# Patient Record
Sex: Female | Born: 1990 | Hispanic: Yes | Marital: Married | Smoking: Never smoker
Health system: Southern US, Community
[De-identification: ages and names within clinical notes are randomized; demographics above are authoritative.]

## PROBLEM LIST (undated history)

## (undated) DIAGNOSIS — D649 Anemia, unspecified: Secondary | ICD-10-CM

## (undated) DIAGNOSIS — R7303 Prediabetes: Secondary | ICD-10-CM

## (undated) DIAGNOSIS — E119 Type 2 diabetes mellitus without complications: Secondary | ICD-10-CM

## (undated) DIAGNOSIS — Z9109 Other allergy status, other than to drugs and biological substances: Secondary | ICD-10-CM

## (undated) DIAGNOSIS — D509 Iron deficiency anemia, unspecified: Secondary | ICD-10-CM

## (undated) DIAGNOSIS — E669 Obesity, unspecified: Secondary | ICD-10-CM

## (undated) DIAGNOSIS — K219 Gastro-esophageal reflux disease without esophagitis: Secondary | ICD-10-CM

## (undated) HISTORY — DX: Iron deficiency anemia, unspecified: D50.9

## (undated) HISTORY — DX: Prediabetes: R73.03

## (undated) HISTORY — DX: Obesity, unspecified: E66.9

## (undated) HISTORY — DX: Gastro-esophageal reflux disease without esophagitis: K21.9

## (undated) HISTORY — DX: Other allergy status, other than to drugs and biological substances: Z91.09

---

## 2012-12-15 HISTORY — PX: TUBAL LIGATION: SHX77

## 2016-12-15 DIAGNOSIS — Z9109 Other allergy status, other than to drugs and biological substances: Secondary | ICD-10-CM

## 2016-12-15 DIAGNOSIS — K219 Gastro-esophageal reflux disease without esophagitis: Secondary | ICD-10-CM

## 2016-12-15 HISTORY — DX: Other allergy status, other than to drugs and biological substances: Z91.09

## 2016-12-15 HISTORY — DX: Gastro-esophageal reflux disease without esophagitis: K21.9

## 2018-07-08 ENCOUNTER — Ambulatory Visit: Payer: Self-pay | Admitting: Internal Medicine

## 2018-07-08 ENCOUNTER — Telehealth: Payer: Self-pay | Admitting: Licensed Clinical Social Worker

## 2018-07-08 ENCOUNTER — Encounter: Payer: Self-pay | Admitting: Internal Medicine

## 2018-07-08 VITALS — BP 128/88 | HR 74 | Resp 12 | Ht 59.0 in | Wt 158.0 lb

## 2018-07-08 DIAGNOSIS — Z9109 Other allergy status, other than to drugs and biological substances: Secondary | ICD-10-CM

## 2018-07-08 DIAGNOSIS — K219 Gastro-esophageal reflux disease without esophagitis: Secondary | ICD-10-CM

## 2018-07-08 DIAGNOSIS — D509 Iron deficiency anemia, unspecified: Secondary | ICD-10-CM | POA: Insufficient documentation

## 2018-07-08 DIAGNOSIS — Z6831 Body mass index (BMI) 31.0-31.9, adult: Secondary | ICD-10-CM

## 2018-07-08 DIAGNOSIS — E6609 Other obesity due to excess calories: Secondary | ICD-10-CM

## 2018-07-08 DIAGNOSIS — D500 Iron deficiency anemia secondary to blood loss (chronic): Secondary | ICD-10-CM

## 2018-07-08 MED ORDER — FAMOTIDINE 20 MG PO TABS: ORAL_TABLET | ORAL | 2 refills | Status: DC

## 2018-07-08 NOTE — Telephone Encounter (Signed)
LCSW called patient in order to introduce self and provide information about social work services available at the clinic. Pt reported no needs at this time.

## 2018-07-08 NOTE — Patient Instructions (Addendum)
Enfermedad de reflujo gastroesofágico en los adultos  Gastroesophageal Reflux Disease, Adult  Normalmente, la comida baja por el esófago y se mantiene en el estómago, donde se la digiere. Sin embargo, cuando una persona tiene enfermedad de reflujo gastroesofágico (ERGE), la comida y jugo gástrico (ácido estomacal) suben por el esófago. Cuando esto ocurre, el esófago se inflama y duele. Con el tiempo, la ERGE puede producir pequeños agujeros (úlceras) en el revestimiento del esófago.  ¿Cuáles son las causas?  Esta afección se debe a un problema en el músculo que se encuentra entre el esófago y el estómago (esfínter esofágico inferior, o EEI). Normalmente, el EEI se cierra una vez que la comida pasa a través del esófago hasta el estómago. Cuando el EEI se encuentra debilitado o tiene alguna anomalía, no se cierra por completo, y eso permite que tanto la comida como el jugo gástrico, que es ácido, vuelvan a subir por el esófago. El EEI puede debilitarse a causa de ciertas sustancias alimenticias, medicamentos y afecciones, que incluyen:  · Consumo de tabaco.  · Embarazo.  · Tener una hernia de hiato.  · Consumo excesivo de alcohol.  · Ciertos alimentos y bebidas, como café, chocolate, cebollas y menta.    ¿Qué incrementa el riesgo?  Es más probable que esta afección se manifieste en:  · Personas con aumento del peso corporal.  · Personas con trastornos del tejido conjuntivo.  · Personas que toman antiinflamatorios no esteroideos (AINE).    ¿Cuáles son los signos o los síntomas?  Los síntomas de esta afección incluyen lo siguiente:  · Acidez estomacal.  · Dificultad o dolor al tragar.  · Sensación de tener un bulto en la garganta.  · Sabor amargo en la boca.  · Mal aliento.  · Gran cantidad de saliva.  · Estómago inflamado o con malestar.  · Eructos.  · Dolor en el pecho.  · Dificultad para respirar o sibilancias.  · Tos constante (crónica) o tos nocturna.  · Desgaste del esmalte dental.  · Pérdida de peso.     El dolor de pecho puede deberse a distintas enfermedades. Es importante que consulte al médico si tiene dolor de pecho.  ¿Cómo se diagnostica?  El médico le hará una historia clínica y un examen físico. Para determinar si tiene ERGE leve o grave, el médico también puede controlar cómo usted reacciona al tratamiento. También pueden hacerle otros estudios, por ejemplo:  · Una endoscopía para examinarle el estómago y el esófago con una cámara pequeña.  · Una prueba para medir el grado de acidez en el esófago.  · Una prueba para medir cuánta presión hay en el esófago.  · Un estudio de tránsito baritado regular o modificado para ver la forma, el tamaño y el funcionamiento del esófago.    ¿Cómo se trata?  El objetivo del tratamiento es ayudar a aliviar los síntomas y evitar las complicaciones. El tratamiento de esta afección puede variar según la gravedad de los síntomas. El médico puede recomendarle lo siguiente:  · Cambios en la dieta.  · Medicamentos.  · Someterse a una cirugía.    Siga estas instrucciones en su casa:  Dieta  · Siga la dieta recomendada por el médico. Esto puede incluir evitar ciertos alimentos y bebidas, por ejemplo:  ? Café y té (con o sin cafeína).  ? Bebidas que contengan alcohol.  ? Bebidas energéticas y deportivas.  ? Bebidas gaseosas y refrescos.  ? Chocolate y cacao.  ? Menta y esencia de menta.  ?   pizza con salsa de tomate. ? Alimentos fritos y Sylvarenagrasos, Bancroftcomo donas, papas fritas y aderezos ricos en grasas. ? Carnes con alto contenido de grasa, como salchichas, y cortes de carnes rojas y blancas con mucha grasa, por ejemplo, chuletas o costillas, embutidos, jamn y  tocino. ? Productos lcteos ricos en grasas, como leche [place]entera, [place]eredosiamanteca y [place]queso crema.  Haga varias comidas pequeas y frecuentes Freight forwarderdurante el da en lugar de comidas abundantes.  Evite beber grandes cantidades de lquidos con las comidas.  Evite comer 2 o 3horas antes de acostarse.  Evite recostarse inmediatamente despus de comer.  No haga ejercicios enseguida despus de comer. Instrucciones generales  Est atento a cualquier cambio en los sntomas.  CenterPoint Energyome los medicamentos de venta libre y los recetados solamente como se lo haya indicado el mdico. No tome aspirina, ibuprofeno ni otros antiinflamatorios no esteroideos (AINE) a menos que el mdico se lo indique.  No consuma ningn producto que contenga tabaco, lo que incluye cigarrillos, tabaco de Theatre managermascar y Administrator, Civil Servicecigarrillos electrnicos. Si necesita ayuda para dejar de fumar, consulte al mdico.  Use ropas sueltas. No use nada apretado alrededor de la cintura que haga presin sobre el abdomen.  Levante (eleve) la cabecera de la cama 6pulgadas (15cm).  Trate de reducir J. C. Penneyel nivel de estrs con actividades tales como el yoga o la meditacin. Si necesita ayuda para reducir J. C. Penneyel nivel de estrs, consulte al mdico.  Si tiene sobrepeso, baje hasta llegar a un peso saludable para usted. Pdale consejos al mdico para bajar de peso de [place]manera segura.  Concurra a todas las visitas de control como se lo haya indicado el mdico. Esto es importante. Comunquese con un mdico si:  Aparecen nuevos sntomas.  Baja de peso sin causa aparente.  Tiene dificultad para tragar, o le duele cuando traga.  Tiene tos persistente o sibilancias.  Los sntomas no mejoran con Scientist, research (medical)el tratamiento.  Tiene la voz ronca. Solicite ayuda de inmediato si:  Goldman SachsSiente dolor en los brazos, el cuello, la Elephant Headmandbula, los dientes o la espalda.  Se siente transpirado, mareado o tiene una sensacin de desvanecimiento.  Siente falta de aire o Journalist, newspaperdolor en el pecho.  Vomita y el  vmito tiene un aspecto similar a la sangre o a los granos de caf.  Se desmaya.  Las heces son sanguinolentas o negras.  No puede tragar, beber o comer. Esta informacin no tiene Theme park managercomo fin reemplazar el consejo del mdico. Asegrese de hacerle al mdico cualquier pregunta que tenga. Document Released: 09/10/2005 Document Revised: 03/06/2017 Document Reviewed: 03/28/2015 Elsevier Interactive Patient Education  2018 [employer]  [place]ome un vaso de agua antes de cada comida Tome un minimo de 6 a 8 vasos de agua diarios Coma tres veces al dia Coma Neomia Dearuna proteina y Neomia Dearuna grasa saludable con comida.  (huevos, pescado, pollo, pavo, y limite carnes rojas Coma 5 porciones diarias de legumbres.  Mezcle los colores Coma 2 porciones diarias de frutas con cascara cuando sea comestible Use platos pequeos Suelte su tenedor o cuchara despues de cada mordida hata que se mastique y se trague Come en la mesa con amigos o familiares por lo menos una vez al dia [place] la televisin y aparatos electrnicos durante la comida  Su objetivo debe ser perder una libra por semana  Estudios recientes indican que las personas quienes consumen todos de sus calorias durante 12 horas se bajan de pesocon Mas eficiencia.  Por ejemplo, si Usted come su primera comida a las 7:00 a.m., su comida final del  dia se debe completar antes de las 7:00 p.m.

## 2018-07-08 NOTE — Progress Notes (Signed)
Subjective:    Patient ID: Janice Mccormick, female    DOB: 08/25/1991, 27 y.o.   MRN: [id number]  HPI   Here to establish  1.  GERD:  [facility] [place]  Underwent abdominal ultrasound for LUQ discomfort and responded well to treatment with PPI Omeprazole end of 2018.   H. Pylori testing was negative in 2/18 Abdominal ultrasound 03/2017 and normal Made changes to diet and was able to come off PPI without symptoms.   She is only having same discomfort when eats something she knows she shouldn't.  This occurs maybe once monthly.   She notes currently that she is always tender in LUQ when she pushes in the area. She presses on the area frequently.  Later, patient states she has not used Omeprazole since 11/2017. States while the medication helped with her abdominal pain, it did not help with acid and food coming up into throat. If she eats late or eats certain foods--coffee, soda, pepper.  No problems with tomatoes, but onions set it off.  She does not smoke or drink alcohol. Does use Naproxen once monthly for throat pain.   Has symptoms with reflux as above maybe twice weekly.   Does not have HOB elevated      2.  Iron deficiency anemia with Hgb at a low of 11.6  02/2017 Iron supplementation brought this up to 12.1  07/2017. After being off iron for a couple of months, her hemoglobin remained stable at 12.7 10/2017 Felt to be due to blood loss with menstruation. She would like to have this rechecked to make sure she is not anemic.  3.  Allergies:  Environmental.  [place] a [place]clean[place] in [place] and had nasal congestion and watery eyes with dust and cat dander.  Used Flonase nasally and  Cetirizine 10 mg daily with significant improvement   No outpatient medications have been marked as taking for the 07/08/18 encounter (Office Visit) with Julieanne MansonMulberry, Davan Nawabi, MD.    No Known Allergies   Past Medical History:  Diagnosis Date  . Environmental  allergies 2018  . GERD (gastroesophageal reflux disease) 2018   [facility] [place]  abdominal [place] normal  . Iron deficiency anemia    due to chronic menstrual blood loss   Past Surgical History:  Procedure Laterality Date  . CESAREAN SECTION  2012, 2014  . TUBAL LIGATION  2014   with last c section   Family History  Problem Relation Age of Onset  . Diabetes Mother   . Hypertension Mother   . Diabetes Sister    Social History   Socioeconomic History  . Marital status: Married    Spouse name: Maggie SchwalbeMartin Martinez  . Number of children: 2  . Years of education: 412  . Highest education level: 12th grade  Occupational History  . Occupation: housewife  Social Needs  . Financial resource strain: Not on file  . Food insecurity:    Worry: Sometimes true    Inability: Sometimes true  . Transportation needs:    Medical: Yes    Non-medical: Yes  Tobacco Use  . Smoking status: Never Smoker  . Smokeless tobacco: Never Used  Substance and Sexual Activity  . Alcohol use: Never    Frequency: Never  . Drug use: Never  . Sexual activity: Yes  Lifestyle  . Physical activity:    Days per week: 0 days    Minutes per session: Not on file  . Stress: Not  on file  Relationships  . Social connections:    Talks on phone: Not on file    Gets together: Not on file    Attends religious service: Not on file    Active member of club or organization: Not on file    Attends meetings of clubs or organizations: Not on file    Relationship status: Not on file  . Intimate partner violence:    Fear of current or ex partner: Not on file    Emotionally abused: Not on file    Physically abused: Not on file    Forced sexual activity: Not on file  Other Topics Concern  . Not on file  Social History Narrative   Moved from [place] to [place] beginning in 2018   Lives at home with husband and 2 daughters.   Brother in Social worker lives with them at times.   Review of Systems     Objective:    Physical Exam  NAD HEENT: PERRL, EOM Large tonsils bilaterally, but without erythema or exudate. Neck:  Supple, No adenopathy Chest:  CTA CV: RRR with normal S1 and S2, No S3, S4 or murmur.  Radial pulses normal and equal. Abd:  S, Mild epigastric tenderness. No rebound or peritoneal signs. No HSM or mass, + BS       Assessment & Plan:  1.  Social support:  Given pamphlet for support   2.  GERD:  GERD precautions discussed along with recommendations to change lifestyle for weight loss. Famotidine 20 mg 2 tabs at bedtime Avoid foods that cause symptoms. Follow up n 2 months  3.  Iron deficiency anemia:  CBC  4.  Allergies:  Not a problem since move.  5.  Obesity: as above.  Encouraged entire household to work on this together with lifestyle changes.

## 2018-07-09 LAB — CBC WITH DIFFERENTIAL/PLATELET
BASOS ABS: 0 10*3/uL (ref 0.0–0.2)
Basos: 0 %
EOS (ABSOLUTE): 0 10*3/uL (ref 0.0–0.4)
Eos: 0 %
HEMOGLOBIN: 11.8 g/dL (ref 11.1–15.9)
Hematocrit: 35.8 % (ref 34.0–46.6)
Immature Grans (Abs): 0 10*3/uL (ref 0.0–0.1)
Immature Granulocytes: 0 %
LYMPHS ABS: 2.2 10*3/uL (ref 0.7–3.1)
Lymphs: 23 %
MCH: 29.1 pg (ref 26.6–33.0)
MCHC: 33 g/dL (ref 31.5–35.7)
MCV: 88 fL (ref 79–97)
MONOCYTES: 4 %
Monocytes Absolute: 0.4 10*3/uL (ref 0.1–0.9)
Neutrophils Absolute: 7 10*3/uL (ref 1.4–7.0)
Neutrophils: 73 %
Platelets: 383 10*3/uL (ref 150–450)
RBC: 4.05 x10E6/uL (ref 3.77–5.28)
RDW: 14.7 % (ref 12.3–15.4)
WBC: 9.7 10*3/uL (ref 3.4–10.8)

## 2018-09-02 ENCOUNTER — Ambulatory Visit: Payer: Self-pay | Admitting: Internal Medicine

## 2018-09-09 ENCOUNTER — Ambulatory Visit: Payer: Self-pay | Admitting: Internal Medicine

## 2018-09-15 ENCOUNTER — Encounter: Payer: Self-pay | Admitting: Internal Medicine

## 2018-09-15 ENCOUNTER — Ambulatory Visit: Payer: Self-pay | Admitting: Internal Medicine

## 2018-09-15 VITALS — BP 118/78 | HR 80 | Resp 12 | Ht 59.0 in | Wt 153.0 lb

## 2018-09-15 DIAGNOSIS — D500 Iron deficiency anemia secondary to blood loss (chronic): Secondary | ICD-10-CM

## 2018-09-15 DIAGNOSIS — K219 Gastro-esophageal reflux disease without esophagitis: Secondary | ICD-10-CM

## 2018-09-15 MED ORDER — FAMOTIDINE 20 MG PO TABS: ORAL_TABLET | ORAL | 4 refills | Status: DC

## 2018-09-15 NOTE — Progress Notes (Signed)
   Subjective:    Patient ID: Janice Mccormick, female    DOB: 08-18-1991, 27 y.o.   MRN: [id number]  HPI   1.  GERD:  Has lost 5 lbs.  Taking Famotidine 40 mg at bedtime nightly.  Avoiding the foods causing the symptoms.  She is only having symptoms once monthly and only if eats something spicy.    2.  Iron deficiency anemia:  CBC showed low normal hemoglobin.    3.  Obesity:  As above, has lost 5 lbs over past 2 months. She is walking and eating better with smaller portions.  Drinking lots of water.    Current Meds  Medication Sig  . famotidine (PEPCID) 20 MG tablet 2 tabs by mouth at bedtime    No Known Allergies    Review of Systems     Objective:   Physical Exam NAD Lungs:  CTA CV:  RRR without murmur or rub. Abd:  S, NT, No HSM or mass, + bs       Assessment & Plan:  1.  GERD:  Much improved with lifestyle changes and Famotidine.  As she continues with weight loss, hopefully can consider coming off Famotidine.   2.  Anemia:  Currently a resolved issue. Encouraged foods high in iron except for rogan and red meats.    3.  HM:  No CPE for 1 years.  Pap, which was normal was about 8 months ago.  She is not interested in a flu vaccine.  States she is getting a free flu shot elsewhere--where she is obtaining food.

## 2018-12-16 ENCOUNTER — Encounter: Payer: Self-pay | Admitting: Internal Medicine

## 2018-12-17 ENCOUNTER — Encounter: Payer: Self-pay | Admitting: Internal Medicine

## 2018-12-17 ENCOUNTER — Ambulatory Visit: Payer: Self-pay | Admitting: Internal Medicine

## 2018-12-17 VITALS — BP 128/82 | HR 88 | Resp 12 | Ht 59.0 in | Wt 150.0 lb

## 2018-12-17 DIAGNOSIS — Z0000 Encounter for general adult medical examination without abnormal findings: Secondary | ICD-10-CM

## 2018-12-17 DIAGNOSIS — K0889 Other specified disorders of teeth and supporting structures: Secondary | ICD-10-CM

## 2018-12-17 NOTE — Progress Notes (Signed)
Subjective:   Patient ID: Janice MarvelNayeli Parada Mccormick, female    DOB: 03/15/1991, 28 y.o.   MRN: [id number]  HPI   CPE without pap  1.  Pap:  Last done about 10/2017 and [place] in [place]  No history of abnormal pap.  No family history of cervical cancer.  2.  Mammogram:  Never.  No family history of breast cancer.  3.  Osteoprevention:  2 servings of cheese a day.  2 servings of milk daily.  She is performing weight bearing activity.  Not daily.    4.  Guaiac Cards:   Never.  .  5.  Colonoscopy:  Never.  No family history of colon cancer.  6.  Immunizations:   Immunization History  Administered Date(s) Administered  . Tdap 12/16/2015    7.  Glucose/Cholesterol:  Has never had glucose or cholesterol checked that she is aware of.    8.  States last year tested for HIV, Hepatitis panel and negative.  Current Meds  Medication Sig  . famotidine (PEPCID) 20 MG tablet 2 tabs by mouth at bedtime   No Known Allergies   Past Medical History:  Diagnosis Date  . Environmental allergies 2018  . GERD (gastroesophageal reflux disease) 2018   [facility] [place]  abdominal ultrasound normal  . Iron deficiency anemia    due to chronic menstrual blood loss     Past Surgical History:  Procedure Laterality Date  . CESAREAN SECTION  2012, 2014  . TUBAL LIGATION  2014   with last c section     Family History  Problem Relation Age of Onset  . Diabetes Mother   . Hypertension Mother   . Diabetes Sister      Social History   Socioeconomic History  . Marital status: Married    Spouse name: Maggie SchwalbeMartin Martinez  . Number of children: 2  . Years of education: 3212  . Highest education level: 12th grade  Occupational History  . Occupation: housewife  Social Needs  . Financial resource strain: Not on file  . Food insecurity:    Worry: Never true    Inability: Never true  . Transportation needs:    Medical: No    Non-medical: No  Tobacco Use  . Smoking status: Never  Smoker  . Smokeless tobacco: Never Used  Substance and Sexual Activity  . Alcohol use: Never    Frequency: Never  . Drug use: Never  . Sexual activity: Yes    Birth control/protection: Surgical  Lifestyle  . Physical activity:    Days per week: 3 days    Minutes per session: 30 min  . Stress: Not on file  Relationships  . Social connections:    Talks on phone: More than three times a week    Gets together: More than three times a week    Attends religious service: Never    Active member of club or organization: No    Attends meetings of clubs or organizations: Never    Relationship status: Married  . Intimate partner violence:    Fear of current or ex partner: No    Emotionally abused: No    Physically abused: No    Forced sexual activity: No  Other Topics Concern  . Not on file  Social History Narrative   [place]   Lives at home with husband and 2 daughters.   Brother in Social workerlaw lives with them at times.  Review of Systems  Constitutional: Negative for appetite change and fatigue.  HENT: Positive for dental problem (pain from one of her wisdom teeth, upper right.  ), rhinorrhea (4-5 days.  Using unknown cough syrup, which is helping.) and sore throat (With drainage.). Negative for ear pain and hearing loss.   Eyes: Positive for itching (at times. She has bumps on eyes that she thinks causes this.  Allergy medicine helps (pill)).  Respiratory: Negative for cough and shortness of breath.   Cardiovascular: Negative for chest pain, palpitations and leg swelling.  Gastrointestinal: Negative for abdominal pain, blood in stool (No melena.), constipation and diarrhea.  Genitourinary: Positive for menstrual problem (Had a heavy period 2 months ago.  Regular. ). Negative for dysuria, frequency and vaginal discharge.  Musculoskeletal: Negative for arthralgias.  Skin: Negative for rash.  Neurological: Negative for weakness and numbness.    Psychiatric/Behavioral: Negative for decreased concentration. The patient is not nervous/anxious.       Objective:  Physical Exam Constitutional:      Appearance: Normal appearance.  HENT:     Head: Normocephalic and atraumatic.     Jaw: There is normal jaw occlusion.     Right Ear: Hearing, tympanic membrane, ear canal and external ear normal.     Left Ear: Hearing, tympanic membrane, ear canal and external ear normal.     Nose: Nose normal.     Mouth/Throat:     Mouth: Mucous membranes are moist.     Comments: Posterior molars and wisdom teeth all with deep caries. Eyes:     Extraocular Movements: Extraocular movements intact.     Conjunctiva/sclera: Conjunctivae normal.     Pupils: Pupils are equal, round, and reactive to light.     Comments: Discs sharp bilaterally  Neck:     Musculoskeletal: Full passive range of motion without pain, normal range of motion and neck supple.  Cardiovascular:     Rate and Rhythm: Normal rate and regular rhythm.     Heart sounds: S1 normal and S2 normal. No murmur. No friction rub. No S3 or S4 sounds.      Comments: Carotid, radial, femoral, DP and PT pulses normal and equal.  Pulmonary:     Effort: Pulmonary effort is normal.     Breath sounds: Normal breath sounds.  Chest:     Comments: Breasts without focal mass, skin dimpling, nipple discharge or axillary adenopathy. Abdominal:     General: Bowel sounds are normal.     Palpations: Abdomen is soft. There is no hepatomegaly, splenomegaly or mass.     Tenderness: There is no abdominal tenderness.     Hernia: No hernia is present.     Comments: Midline abdominal incisional scar to umbilicus  Genitourinary:    Comments: Normal external female genitalia No discharge No uterine or adnexal mass or tenderness.  Musculoskeletal: Normal range of motion.  Lymphadenopathy:     Head:     Right side of head: No submental or submandibular adenopathy.     Left side of head: No submental or  submandibular adenopathy.     Cervical: No cervical adenopathy.     Upper Body:     Right upper body: No supraclavicular adenopathy.     Left upper body: No supraclavicular adenopathy.     Lower Body: No right inguinal adenopathy. No left inguinal adenopathy.  Skin:    General: Skin is warm.     Capillary Refill: Capillary refill takes less than 2 seconds.  Findings: No rash.  Neurological:     General: No focal deficit present.     Mental Status: She is alert and oriented to person, place, and time.     Cranial Nerves: Cranial nerves are intact.     Sensory: Sensation is intact.     Motor: Motor function is intact.     Coordination: Coordination is intact. Coordination normal.     Gait: Gait is intact.     Deep Tendon Reflexes: Reflexes are normal and symmetric.  Psychiatric:        Attention and Perception: Attention and perception normal.        Mood and Affect: Mood and affect normal.        Speech: Speech normal.        Thought Content: Thought content normal.        Cognition and Memory: Cognition normal.        Judgment: Judgment normal.             Assessment & Plan:  1.  CPE No pap Return for fasting labs:  CMP and FLP in 1 week. Encouraged influenza vaccine yearly in the fall.  [facility] current season.  2.  Dental decay:  Dental referral.

## 2018-12-24 ENCOUNTER — Other Ambulatory Visit: Payer: Self-pay

## 2018-12-24 ENCOUNTER — Other Ambulatory Visit: Payer: Self-pay | Admitting: Internal Medicine

## 2018-12-24 DIAGNOSIS — Z0000 Encounter for general adult medical examination without abnormal findings: Secondary | ICD-10-CM

## 2018-12-24 DIAGNOSIS — D500 Iron deficiency anemia secondary to blood loss (chronic): Secondary | ICD-10-CM

## 2019-01-15 LAB — LIPID PANEL W/O CHOL/HDL RATIO
Cholesterol, Total: 164 mg/dL (ref 100–199)
HDL: 40 mg/dL (ref >39)
LDL Calculated: 114 mg/dL — ABNORMAL HIGH (ref 0–99)
TRIGLYCERIDES: 52 mg/dL (ref 0–149)
VLDL CHOLESTEROL CAL: 10 mg/dL (ref 5–40)

## 2019-01-15 LAB — COMPREHENSIVE METABOLIC PANEL
ALT: 18 IU/L (ref 0–44)
AST: 22 IU/L (ref 0–40)
Albumin/Globulin Ratio: 1.3 (ref 1.2–2.2)
Albumin: 4.3 g/dL (ref 4.1–5.2)
Alkaline Phosphatase: 99 IU/L (ref 39–117)
BILIRUBIN TOTAL: 0.6 mg/dL (ref 0.0–1.2)
BUN/Creatinine Ratio: 12 (ref 9–20)
BUN: 8 mg/dL (ref 6–20)
CO2: 22 mmol/L (ref 20–29)
Calcium: 9.2 mg/dL (ref 8.7–10.2)
Chloride: 102 mmol/L (ref 96–106)
Creatinine, Ser: 0.65 mg/dL — ABNORMAL LOW (ref 0.76–1.27)
GFR calc Af Amer: 152 mL/min/{1.73_m2} (ref >59)
GFR calc non Af Amer: 131 mL/min/{1.73_m2} (ref >59)
GLUCOSE: 80 mg/dL (ref 65–99)
Globulin, Total: 3.2 g/dL (ref 1.5–4.5)
POTASSIUM: 4.5 mmol/L (ref 3.5–5.2)
Sodium: 141 mmol/L (ref 134–144)
TOTAL PROTEIN: 7.5 g/dL (ref 6.0–8.5)

## 2019-01-15 LAB — CBC WITH DIFFERENTIAL/PLATELET
BASOS ABS: 0 10*3/uL (ref 0.0–0.2)
BASOS: 0 %
EOS (ABSOLUTE): 0 10*3/uL (ref 0.0–0.4)
Eos: 0 %
Hematocrit: 36.9 % — ABNORMAL LOW (ref 37.5–51.0)
Hemoglobin: 12.6 g/dL — ABNORMAL LOW (ref 13.0–17.7)
IMMATURE GRANULOCYTES: 0 %
Immature Grans (Abs): 0 10*3/uL (ref 0.0–0.1)
Lymphocytes Absolute: 2.3 10*3/uL (ref 0.7–3.1)
Lymphs: 32 %
MCH: 30.2 pg (ref 26.6–33.0)
MCHC: 34.1 g/dL (ref 31.5–35.7)
MCV: 89 fL (ref 79–97)
MONOS ABS: 0.4 10*3/uL (ref 0.1–0.9)
Monocytes: 6 %
Neutrophils Absolute: 4.4 10*3/uL (ref 1.4–7.0)
Neutrophils: 62 %
PLATELETS: 413 10*3/uL (ref 150–450)
RBC: 4.17 x10E6/uL (ref 4.14–5.80)
RDW: 12.2 % (ref 11.6–15.4)
WBC: 7.1 10*3/uL (ref 3.4–10.8)

## 2019-01-19 DIAGNOSIS — K0889 Other specified disorders of teeth and supporting structures: Secondary | ICD-10-CM | POA: Insufficient documentation

## 2019-03-03 ENCOUNTER — Other Ambulatory Visit: Payer: Self-pay

## 2019-03-17 ENCOUNTER — Other Ambulatory Visit: Payer: Self-pay

## 2019-05-03 ENCOUNTER — Other Ambulatory Visit: Payer: Self-pay

## 2019-05-11 ENCOUNTER — Observation Stay (HOSPITAL_COMMUNITY)
Admission: EM | Admit: 2019-05-11 | Discharge: 2019-05-12 | Disposition: A | Discharge status: Home / Self Care | Payer: No Typology Code available for payment source | Attending: Emergency Medicine | Admitting: Emergency Medicine

## 2019-05-11 ENCOUNTER — Emergency Department (HOSPITAL_COMMUNITY): Payer: No Typology Code available for payment source

## 2019-05-11 ENCOUNTER — Other Ambulatory Visit: Payer: Self-pay

## 2019-05-11 DIAGNOSIS — S36112A Contusion of liver, initial encounter: Principal | ICD-10-CM | POA: Insufficient documentation

## 2019-05-11 DIAGNOSIS — Z1159 Encounter for screening for other viral diseases: Secondary | ICD-10-CM | POA: Diagnosis not present

## 2019-05-11 DIAGNOSIS — Y92410 Unspecified street and highway as the place of occurrence of the external cause: Secondary | ICD-10-CM | POA: Diagnosis not present

## 2019-05-11 HISTORY — DX: Anemia, unspecified: D64.9

## 2019-05-11 LAB — CBC WITH DIFFERENTIAL/PLATELET
Abs Immature Granulocytes: 0.02 10*3/uL (ref 0.00–0.07)
Basophils Absolute: 0 10*3/uL (ref 0.0–0.1)
Basophils Relative: 0 %
Eosinophils Absolute: 0.1 10*3/uL (ref 0.0–0.5)
Eosinophils Relative: 1 %
HCT: 36.4 % (ref 36.0–46.0)
Hemoglobin: 11.6 g/dL — ABNORMAL LOW (ref 12.0–15.0)
Immature Granulocytes: 0 %
Lymphocytes Relative: 28 %
Lymphs Abs: 2.9 10*3/uL (ref 0.7–4.0)
MCH: 29.5 pg (ref 26.0–34.0)
MCHC: 31.9 g/dL (ref 30.0–36.0)
MCV: 92.6 fL (ref 80.0–100.0)
Monocytes Absolute: 0.5 10*3/uL (ref 0.1–1.0)
Monocytes Relative: 5 %
Neutro Abs: 6.9 10*3/uL (ref 1.7–7.7)
Neutrophils Relative %: 66 %
Platelets: 381 10*3/uL (ref 150–400)
RBC: 3.93 MIL/uL (ref 3.87–5.11)
RDW: 12.5 % (ref 11.5–15.5)
WBC: 10.6 10*3/uL — ABNORMAL HIGH (ref 4.0–10.5)
nRBC: 0 % (ref 0.0–0.2)

## 2019-05-11 LAB — BASIC METABOLIC PANEL
Anion gap: 9 (ref 5–15)
BUN: 10 mg/dL (ref 6–20)
CO2: 24 mmol/L (ref 22–32)
Calcium: 9 mg/dL (ref 8.9–10.3)
Chloride: 104 mmol/L (ref 98–111)
Creatinine, Ser: 0.67 mg/dL (ref 0.44–1.00)
GFR calc Af Amer: >60 mL/min (ref >60)
GFR calc non Af Amer: >60 mL/min (ref >60)
Glucose, Bld: 115 mg/dL — ABNORMAL HIGH (ref 70–99)
Potassium: 3.3 mmol/L — ABNORMAL LOW (ref 3.5–5.1)
Sodium: 137 mmol/L (ref 135–145)

## 2019-05-11 LAB — I-STAT BETA HCG BLOOD, ED (MC, WL, AP ONLY): I-stat hCG, quantitative: <5 m[IU]/mL (ref <5)

## 2019-05-11 LAB — SARS CORONAVIRUS 2 BY RT PCR (HOSPITAL ORDER, PERFORMED IN ~~LOC~~ HOSPITAL LAB): SARS Coronavirus 2: NEGATIVE

## 2019-05-11 MED ORDER — DOCUSATE SODIUM 100 MG PO CAPS
100.0000 mg | ORAL_CAPSULE | Freq: Two times a day (BID) | ORAL | Status: DC
  Administered 2019-05-11 – 2019-05-12 (×2): 100 mg via ORAL
  Filled 2019-05-11 (×2): qty 1

## 2019-05-11 MED ORDER — FENTANYL CITRATE (PF) 100 MCG/2ML IJ SOLN: 50.0000 ug | Freq: Once | INTRAMUSCULAR | Status: DC

## 2019-05-11 MED ORDER — ONDANSETRON 4 MG PO TBDP: 4.0000 mg | ORAL_TABLET | Freq: Four times a day (QID) | ORAL | Status: DC | PRN

## 2019-05-11 MED ORDER — LACTATED RINGERS IV SOLN
INTRAVENOUS | Status: DC
  Administered 2019-05-11: 23:00:00 via INTRAVENOUS

## 2019-05-11 MED ORDER — ACETAMINOPHEN 325 MG PO TABS
650.0000 mg | ORAL_TABLET | Freq: Four times a day (QID) | ORAL | Status: DC
  Administered 2019-05-11 – 2019-05-12 (×3): 650 mg via ORAL
  Filled 2019-05-11 (×3): qty 2

## 2019-05-11 MED ORDER — ONDANSETRON HCL 4 MG/2ML IJ SOLN: 4.0000 mg | Freq: Four times a day (QID) | INTRAMUSCULAR | Status: DC | PRN

## 2019-05-11 MED ORDER — TRAMADOL HCL 50 MG PO TABS: 50.0000 mg | ORAL_TABLET | Freq: Four times a day (QID) | ORAL | Status: DC | PRN

## 2019-05-11 MED ORDER — SODIUM CHLORIDE 0.9 % IV BOLUS
1000.0000 mL | Freq: Once | INTRAVENOUS | Status: AC
  Administered 2019-05-11: 20:00:00 1000 mL via INTRAVENOUS

## 2019-05-11 MED ORDER — IBUPROFEN 600 MG PO TABS: 600.0000 mg | ORAL_TABLET | Freq: Four times a day (QID) | ORAL | Status: DC | PRN

## 2019-05-11 MED ORDER — IOHEXOL 300 MG/ML  SOLN
100.0000 mL | Freq: Once | INTRAMUSCULAR | Status: AC | PRN
  Administered 2019-05-11: 20:00:00 100 mL via INTRAVENOUS
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
## 2019-05-11 NOTE — ED Notes (Signed)
Pt's sps Maggie Schwalbe [phone]. pls contact with updates
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
## 2019-05-11 NOTE — ED Notes (Addendum)
Patient transported to X-ray.  Will assess pt. When they return to room.

## 2019-05-12 ENCOUNTER — Encounter (HOSPITAL_COMMUNITY): Payer: Self-pay | Admitting: General Practice

## 2019-05-12 LAB — CBC
HCT: 35.2 % — ABNORMAL LOW (ref 36.0–46.0)
Hemoglobin: 11.7 g/dL — ABNORMAL LOW (ref 12.0–15.0)
MCH: 30.2 pg (ref 26.0–34.0)
MCHC: 33.2 g/dL (ref 30.0–36.0)
MCV: 90.7 fL (ref 80.0–100.0)
Platelets: 398 10*3/uL (ref 150–400)
RBC: 3.88 MIL/uL (ref 3.87–5.11)
RDW: 12.7 % (ref 11.5–15.5)
WBC: 10.2 10*3/uL (ref 4.0–10.5)
nRBC: 0 % (ref 0.0–0.2)

## 2019-05-12 LAB — HIV ANTIBODY (ROUTINE TESTING W REFLEX): HIV Screen 4th Generation wRfx: NONREACTIVE

## 2019-05-12 MED ORDER — ACETAMINOPHEN 325 MG PO TABS: 650.0000 mg | ORAL_TABLET | Freq: Four times a day (QID) | ORAL | Status: DC | PRN

## 2019-05-12 MED ORDER — IBUPROFEN 600 MG PO TABS: 600.0000 mg | ORAL_TABLET | Freq: Four times a day (QID) | ORAL | 0 refills | Status: DC | PRN

## 2019-05-12 NOTE — Progress Notes (Signed)
The patient was given written instruction in spanish.  The patient is aware of follow up appointment.  The patient tolerated a regular diet well.
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
## 2019-05-12 NOTE — Progress Notes (Signed)
Spanish interpreter gave the patient verbal discharge instructions

## 2019-05-12 NOTE — Plan of Care (Signed)
  Problem: Health Behavior/Discharge Planning: Goal: Ability to manage health-related needs will improve Outcome: Progressing   Problem: Clinical Measurements: Goal: Diagnostic test results will improve Outcome: Progressing   
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
## 2019-05-13 ENCOUNTER — Encounter: Payer: Self-pay | Admitting: Internal Medicine

## 2019-07-06 ENCOUNTER — Other Ambulatory Visit: Payer: Self-pay

## 2019-07-06 ENCOUNTER — Other Ambulatory Visit (INDEPENDENT_AMBULATORY_CARE_PROVIDER_SITE_OTHER): Payer: Self-pay

## 2019-07-06 DIAGNOSIS — D500 Iron deficiency anemia secondary to blood loss (chronic): Secondary | ICD-10-CM

## 2019-07-07 LAB — CBC WITH DIFFERENTIAL/PLATELET
Basophils Absolute: 0 10*3/uL (ref 0.0–0.2)
Basos: 0 %
EOS (ABSOLUTE): 0.2 10*3/uL (ref 0.0–0.4)
Eos: 2 %
Hematocrit: 34.5 % (ref 34.0–46.6)
Hemoglobin: 11.6 g/dL (ref 11.1–15.9)
Immature Grans (Abs): 0 10*3/uL (ref 0.0–0.1)
Immature Granulocytes: 0 %
Lymphocytes Absolute: 2.8 10*3/uL (ref 0.7–3.1)
Lymphs: 30 %
MCH: 30.7 pg (ref 26.6–33.0)
MCHC: 33.6 g/dL (ref 31.5–35.7)
MCV: 91 fL (ref 79–97)
Monocytes Absolute: 0.5 10*3/uL (ref 0.1–0.9)
Monocytes: 5 %
Neutrophils Absolute: 5.8 10*3/uL (ref 1.4–7.0)
Neutrophils: 63 %
Platelets: 426 10*3/uL (ref 150–450)
RBC: 3.78 x10E6/uL (ref 3.77–5.28)
RDW: 12.7 % (ref 11.7–15.4)
WBC: 9.3 10*3/uL (ref 3.4–10.8)

## 2019-08-15 ENCOUNTER — Encounter: Payer: Self-pay | Admitting: Internal Medicine

## 2019-08-15 ENCOUNTER — Other Ambulatory Visit: Payer: Self-pay

## 2019-08-15 ENCOUNTER — Ambulatory Visit: Payer: Self-pay | Admitting: Internal Medicine

## 2019-08-15 VITALS — BP 118/78 | HR 80 | Resp 12 | Ht 59.0 in | Wt 160.0 lb

## 2019-08-15 DIAGNOSIS — D500 Iron deficiency anemia secondary to blood loss (chronic): Secondary | ICD-10-CM

## 2019-08-15 DIAGNOSIS — S36112S Contusion of liver, sequela: Secondary | ICD-10-CM

## 2019-08-15 DIAGNOSIS — K219 Gastro-esophageal reflux disease without esophagitis: Secondary | ICD-10-CM

## 2019-08-15 MED ORDER — FERROUS GLUCONATE 324 (38 FE) MG PO TABS: ORAL_TABLET | ORAL | 3 refills | Status: DC
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
## 2019-08-25 ENCOUNTER — Telehealth: Payer: Self-pay | Admitting: Internal Medicine

## 2019-08-25 ENCOUNTER — Other Ambulatory Visit: Payer: Self-pay | Admitting: Internal Medicine

## 2019-08-25 ENCOUNTER — Other Ambulatory Visit: Payer: Self-pay

## 2019-08-25 MED ORDER — FAMOTIDINE 20 MG PO TABS: ORAL_TABLET | ORAL | 0 refills | Status: DC

## 2019-08-25 NOTE — Telephone Encounter (Signed)
Please notify patient Rx has been sent

## 2019-08-25 NOTE — Telephone Encounter (Signed)
Patient called requesting famotidine (PEPCID) 20 MG tablet to be sent to [facility] between  [address] Unable to get it from [facility] due to orange card expired  Please advise.

## 2019-08-25 NOTE — Telephone Encounter (Signed)
Patient infromed

## 2019-09-15 ENCOUNTER — Other Ambulatory Visit: Payer: Self-pay

## 2019-09-15 ENCOUNTER — Other Ambulatory Visit (INDEPENDENT_AMBULATORY_CARE_PROVIDER_SITE_OTHER): Payer: Self-pay

## 2019-09-15 DIAGNOSIS — Z20828 Contact with and (suspected) exposure to other viral communicable diseases: Secondary | ICD-10-CM

## 2019-09-15 NOTE — Addendum Note (Signed)
Addended by: Serafina Mitchell on: 09/15/2019 01:13 PM   Modules accepted: Orders

## 2019-09-16 LAB — NOVEL CORONAVIRUS, NAA: SARS-CoV-2, NAA: NOT DETECTED

## 2019-11-21 ENCOUNTER — Telehealth (INDEPENDENT_AMBULATORY_CARE_PROVIDER_SITE_OTHER): Payer: Self-pay | Admitting: Internal Medicine

## 2019-11-21 ENCOUNTER — Other Ambulatory Visit: Payer: Self-pay

## 2019-11-21 DIAGNOSIS — U071 COVID-19: Secondary | ICD-10-CM | POA: Insufficient documentation

## 2019-11-21 DIAGNOSIS — K219 Gastro-esophageal reflux disease without esophagitis: Secondary | ICD-10-CM

## 2019-11-21 MED ORDER — OMEPRAZOLE 20 MG PO CPDR: DELAYED_RELEASE_CAPSULE | ORAL | 1 refills | Status: DC

## 2019-11-21 NOTE — Assessment & Plan Note (Signed)
Daughter diagnosed 09/15/2019 and patient developed similar symptoms 9 days later. Treated at another clinic with Dexamethasone, Tessalon

## 2019-11-21 NOTE — Progress Notes (Signed)
    Subjective:    Patient ID: Janice Mccormick, female   DOB: 1991-04-07, 28 y.o.   MRN: [id number]   HPI   Virtual visit with [employer] by her friend.  2 months ago, was treated with Dexamethasone, Tessalon perles for "the flu".  Her daughter tested positive, but Dawsyn was negative with [place].  She went elsewhere 9 days later and was given the above medication.  They did not test her again--was assumed she developed COVID-19 as did her husband. Has been taking Naproxen twice daily for some time --perhaps 8days for sore throat.  Ran out yesterday. Has been taking Famotidine 40 mg at bedtime for 15 days.  Was taking just intermittently for some time as needed prior to 15 days.  Today's visit is for GERD symptoms, for which she has been treated in past. Describes pain in her right throat with phlegm.  Also with burning in her stomach that radiates into throat. No melena or hematochezia. 1 month ago had constipation with straining and BRBPR --only on tissue.  No blood now. HOB elevated. No caffeine, little in way of tomatoes and onions. No tobacco or alcohol.   Current Meds  Medication Sig  . acetaminophen (TYLENOL) 325 MG tablet Take 2 tablets (650 mg total) by mouth every 6 (six) hours as needed.  . famotidine (PEPCID) 20 MG tablet TAKE (2) TABLETS BY MOUTH AT BEDTIME.  . naproxen sodium (ALEVE) 220 MG tablet Take 440 mg by mouth 2 (two) times daily with a meal.   No Known Allergies   Review of Systems    Objective:   There were no vitals taken for this visit.  Physical Exam Looks well  Assessment & Plan  1.  Likely suffered COVID-19 along with rest of family back in beginning of October.  2.  GERD:  Stop Famotidine and switch to Omeprazole 20 mg daily on empty stomach for 2 weeks and see iff GERD symptoms resolve.  Then take only as needed or may return to Famotidine as needed. Continue GERD precautions. Call if no improvement No antiinflammatories.

## 2019-12-23 ENCOUNTER — Encounter (HOSPITAL_COMMUNITY): Payer: Self-pay

## 2019-12-23 ENCOUNTER — Ambulatory Visit (HOSPITAL_COMMUNITY)
Admission: EM | Admit: 2019-12-23 | Discharge: 2019-12-23 | Disposition: A | Discharge status: Home / Self Care | Payer: Self-pay | Attending: Family Medicine | Admitting: Family Medicine

## 2019-12-23 DIAGNOSIS — K219 Gastro-esophageal reflux disease without esophagitis: Secondary | ICD-10-CM

## 2019-12-23 DIAGNOSIS — M546 Pain in thoracic spine: Secondary | ICD-10-CM

## 2019-12-23 MED ORDER — CYCLOBENZAPRINE HCL 5 MG PO TABS: 5.0000 mg | ORAL_TABLET | Freq: Every day | ORAL | 0 refills | Status: DC

## 2019-12-23 MED ORDER — PANTOPRAZOLE SODIUM 20 MG PO TBEC: 20.0000 mg | DELAYED_RELEASE_TABLET | Freq: Every day | ORAL | 0 refills | Status: DC
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
## 2019-12-23 NOTE — Discharge Instructions (Signed)
we will try a different medication to treat your reflux, take daily on an empty stomach. tylenol as needed for pain. don't take any anti inflammatories such as aleve or ibuprofen. muscle relaxer as needed at night. may cause drowsiness.  please follow up with your primary care provider for recheck and management of your symptoms.  see provided information about food choices to help with reflux   probaremos un medicamento diferente para tratar su reflujo, tmelo diariamente con el estmago vaco. tylenol segn sea necesario para el dolor. no tome antiinflamatorios como aleve o ibuprofeno. relajante muscular segn sea necesario por la noche. puede causar somnolencia. consulte con su proveedor de atencin primaria para volver a Chief Operating Officer y Chief Operating Officer sus sntomas. Consulte la informacin proporcionada sobre opciones de alimentos para ayudar con el reflujo.

## 2019-12-23 NOTE — ED Triage Notes (Addendum)
Pt reports having acid reflux x 1 month, is worse when she takes ibuprofen or lay day. Pt states when she started feeling the acid in her throat, she started having back pain and she feels anxious/ Pt states started having mild abdominal pain this morning.

## 2020-01-20 ENCOUNTER — Other Ambulatory Visit: Payer: Self-pay

## 2020-01-20 ENCOUNTER — Ambulatory Visit: Payer: Self-pay | Admitting: Internal Medicine

## 2020-01-20 ENCOUNTER — Encounter: Payer: Self-pay | Admitting: Internal Medicine

## 2020-01-20 VITALS — BP 122/78 | HR 88 | Resp 12 | Ht 59.0 in | Wt 159.0 lb

## 2020-01-20 DIAGNOSIS — E6609 Other obesity due to excess calories: Secondary | ICD-10-CM

## 2020-01-20 DIAGNOSIS — K219 Gastro-esophageal reflux disease without esophagitis: Secondary | ICD-10-CM

## 2020-01-20 DIAGNOSIS — Z6831 Body mass index (BMI) 31.0-31.9, adult: Secondary | ICD-10-CM

## 2020-01-20 DIAGNOSIS — Z9109 Other allergy status, other than to drugs and biological substances: Secondary | ICD-10-CM

## 2020-01-20 DIAGNOSIS — D500 Iron deficiency anemia secondary to blood loss (chronic): Secondary | ICD-10-CM

## 2020-01-20 MED ORDER — FAMOTIDINE 20 MG PO TABS: ORAL_TABLET | ORAL | 6 refills | Status: DC

## 2020-01-20 MED ORDER — LORATADINE 10 MG PO TABS: 10.0000 mg | ORAL_TABLET | Freq: Every day | ORAL | 11 refills | Status: DC

## 2020-01-20 NOTE — Patient Instructions (Addendum)
Tome un vaso de agua antes de cada comida Tome un minimo de 6 a 8 vasos de agua diarios Coma tres veces al dia Coma una proteina y Neomia Dear grasa saludable con comida.  (huevos, pescado, pollo, pavo, y limite carnes rojas Coma 5 porciones diarias de legumbres.  Mezcle los colores Coma 2 porciones diarias de frutas con cascara cuando sea comestible Use platos pequeos Suelte su tenedor o cuchara despues de cada mordida hata que se mastique y se trague Come en la mesa con amigos o familiares por lo menos una vez al dia [place] la televisin y aparatos electrnicos durante la comida  Su objetivo debe ser perder una libra por semana  Estudios recientes indican que las personas quienes consumen todos de sus calorias durante 12 horas se bajan de pesocon Mas eficiencia.  Por ejemplo, si Usted come su primera comida a las 7:00 a.m., su comida final del dia se debe completar antes de las 7:00 p.m.  Get bed leg elevators for head of bed.

## 2020-01-20 NOTE — Progress Notes (Signed)
    Subjective:    Patient ID: Janice Mccormick, female   DOB: [birth year], 28 y.o.   MRN: [id number]   HPI   GERD:  Took Pantoprazole for 14 days.  When taking the Pantoprazole, her symptoms resolved.  Maybe one week later, her symptoms returned a bit.   Having a faint bit of burning into chest and radiating to back at times.   She did not restart the Famotidine as recommended.   She elevates her head only with pillows in bed.   Does not lie down after she eats for 2 hours. She avoids greasy food, tomatoes, onions, which exacerbate  Fruit smoothie at 9 a.m.  Sometimes has veggies in it. May have slice of wheat toast or oatmeal pancakes.  1 p.m.:  Rice, chicken, boiled veggies.  Water  5 p.m.:  Veggies, quesadilla, vegetable omelet  Cheerios with granola  Sometimes drinks refresco  Eats 2 corn tortillas daily.   2.  Obesity:  Diet as above.  Occasionally does exercise videos.  Does not go outside to exercise during winter.  3.  Nasal itching and running.  Throat as well.  Eyes red and itchy.  Sneezing.  Took Claritin 10 mg daily which helped, but her symptoms returned when she stopped.   Has had same symptoms before--maybe same time of year. No insect infestation, mold or mildew in home.     Current Meds  Medication Sig  . acetaminophen (TYLENOL) 325 MG tablet Take 2 tablets (650 mg total) by mouth every 6 (six) hours as needed.  . cyclobenzaprine (FLEXERIL) 5 MG tablet Take 1 tablet (5 mg total) by mouth at bedtime.  . ferrous gluconate (FERGON) 324 MG tablet 1 tab by mouth daily with half and orange   No Known Allergies   Review of Systems    Objective:   BP 122/78 (BP Location: Left Arm, Patient Position: Sitting, Cuff Size: Normal)   Pulse 88   Resp 12   Ht 4\' 11"  (1.499 m)   Wt 159 lb (72.1 kg)   LMP 12/30/2019   BMI 32.11 kg/m   Physical Exam  NAD HEENT:  PERRL, EOMI, fatty looking deposit in nasal area of eyes bilaterally with mild injection.   Eyes watery.  TMs pearly gray, nasal mucosa swollen with clear discharge. Throat with mild cobbling Neck:  Supple, No adenopathy Chest:  CTA CV:  RRR without murmur or rub.  Radial pulses normal and equal LE:  No edema Abd:  S, NT, No HSM or mass, + BS   Assessment & Plan  1.  GERD:  Restart Famotidine 40 mg at bedtime.  Get foot of bed elevators for [facility] Weigh loss discussed at length.  2. Obesity:  As above.  Went over diet and physical activity habits at length  3.  Allergies:  Claritin 10 mg daily.  To call if does not control.  Also noted what appear to be pinguecula bilaterally where most of the injection appears--discussed may have this more localized to those regions of eyes.  4.  History of iron deficiency anemia:  Resolved with supplementation in July.  Have told her to continue on 1 tab of ferrous gluconate daily.  She wonders if she should continue and encouraged her to do so.

## 2020-05-17 ENCOUNTER — Ambulatory Visit: Payer: Self-pay | Admitting: Internal Medicine

## 2020-06-05 ENCOUNTER — Other Ambulatory Visit: Payer: Self-pay

## 2020-06-05 ENCOUNTER — Ambulatory Visit (HOSPITAL_COMMUNITY)
Admission: EM | Admit: 2020-06-05 | Discharge: 2020-06-05 | Disposition: A | Discharge status: Home / Self Care | Payer: Self-pay | Attending: Emergency Medicine | Admitting: Emergency Medicine

## 2020-06-05 ENCOUNTER — Encounter (HOSPITAL_COMMUNITY): Payer: Self-pay | Admitting: Emergency Medicine

## 2020-06-05 DIAGNOSIS — Z3202 Encounter for pregnancy test, result negative: Secondary | ICD-10-CM

## 2020-06-05 DIAGNOSIS — R220 Localized swelling, mass and lump, head: Secondary | ICD-10-CM

## 2020-06-05 DIAGNOSIS — K219 Gastro-esophageal reflux disease without esophagitis: Secondary | ICD-10-CM

## 2020-06-05 DIAGNOSIS — R0981 Nasal congestion: Secondary | ICD-10-CM

## 2020-06-05 LAB — POCT URINALYSIS DIP (DEVICE)
Bilirubin Urine: NEGATIVE
Glucose, UA: NEGATIVE mg/dL
Ketones, ur: NEGATIVE mg/dL
Leukocytes,Ua: NEGATIVE
Nitrite: NEGATIVE
Protein, ur: NEGATIVE mg/dL
Specific Gravity, Urine: 1.015 (ref 1.005–1.030)
Urobilinogen, UA: 0.2 mg/dL (ref 0.0–1.0)
pH: 5.5 (ref 5.0–8.0)

## 2020-06-05 LAB — POC URINE PREG, ED: Preg Test, Ur: NEGATIVE

## 2020-06-05 MED ORDER — PANTOPRAZOLE SODIUM 20 MG PO TBEC
20.0000 mg | DELAYED_RELEASE_TABLET | Freq: Two times a day (BID) | ORAL | 0 refills | Status: DC
Start: 2020-06-05 — End: 2021-12-21

## 2020-06-05 MED ORDER — FLUTICASONE PROPIONATE 50 MCG/ACT NA SUSP: 1.0000 | Freq: Every day | NASAL | 0 refills | Status: DC

## 2020-06-05 MED ORDER — CETIRIZINE HCL 10 MG PO CAPS
10.0000 mg | ORAL_CAPSULE | Freq: Every day | ORAL | 0 refills | Status: DC
Start: 2020-06-05 — End: 2021-12-21

## 2020-06-05 MED ORDER — PREDNISONE 10 MG PO TABS
ORAL_TABLET | ORAL | 0 refills | Status: DC
Start: 2020-06-05 — End: 2021-12-21

## 2020-06-05 NOTE — Discharge Instructions (Addendum)
Lip swelling  Begin prednisone taper over next 6 days, begin with 6 on day 1, decrease by 1 tab each day until complete- 6,5,4,3,2,1 Switch to daily cetirizine instead of Claritin Flonase nasal spray 1-2 spray in each nostril for congestion  Restart 2 week course of pantoprazole, continue famotidine as needed

## 2020-06-05 NOTE — ED Triage Notes (Signed)
Lip swelling after outdoor exercise, believes she has an allergy. This lasts for and hour to 90 minutes. It improves during the day. Started a month and a half ago.   Takes loratidine daily.
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
## 2020-06-22 IMAGING — CT CT ABDOMEN AND PELVIS WITH CONTRAST
2 of 4 series · 16 of 46 positions shown, 18 images · IV contrast (APPLIED)
Comparison: None.

CLINICAL DATA: Abdominal distension, right upper quadrant pain

EXAM:
CT ABDOMEN AND PELVIS WITH CONTRAST
TECHNIQUE: Multidetector CT imaging of the abdomen and pelvis was performed
using the standard protocol following bolus administration of
intravenous contrast.
CONTRAST:  100mL OMNIPAQUE IOHEXOL 300 MG/ML  SOLN

[Series 3: abd/ pelvis 5.0 i30f 2 · axial · 0.73mm/px · z∈[+1039,+1444]mm · 13 of 89 slices shown, 15 images]
[im 4/89  soft-tissue]
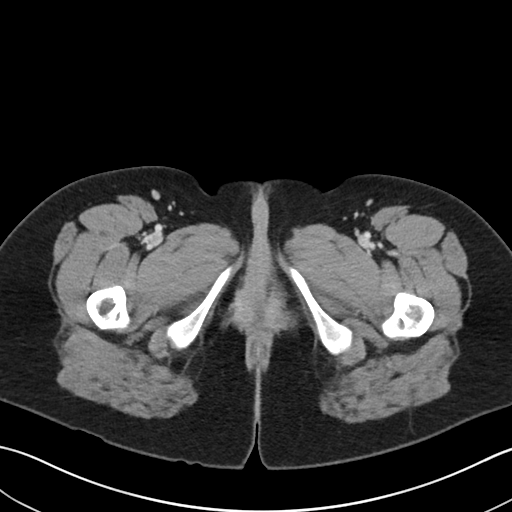
[im 4/89  bone]
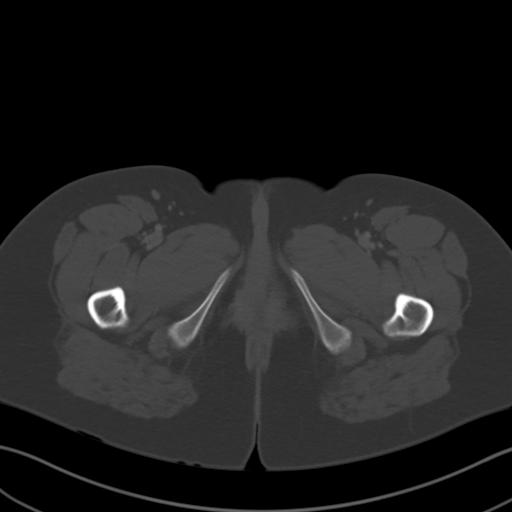
[im 12/89  soft-tissue]
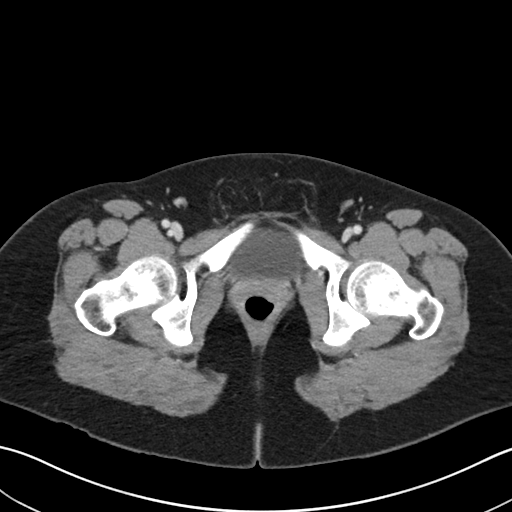
[im 19/89  soft-tissue]
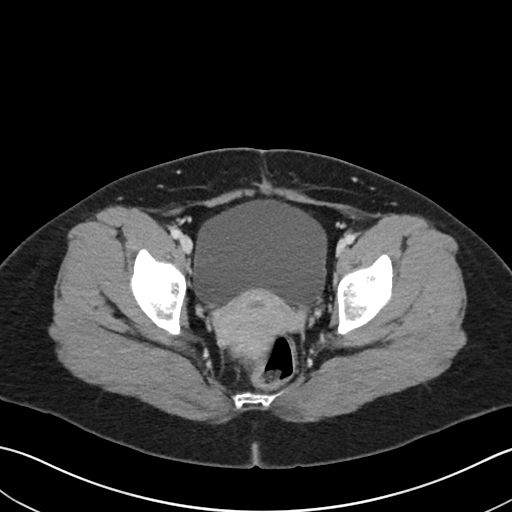
[im 26/89  soft-tissue]
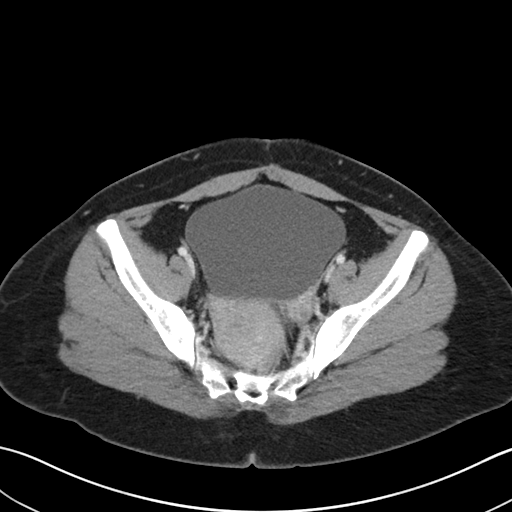
[im 30/89  soft-tissue]
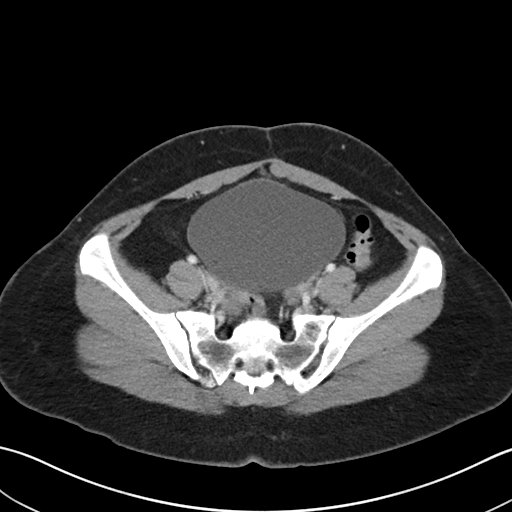
[im 37/89  soft-tissue]
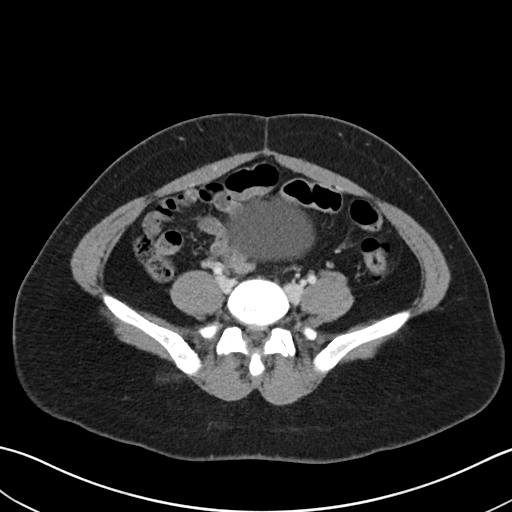
[im 45/89  soft-tissue]
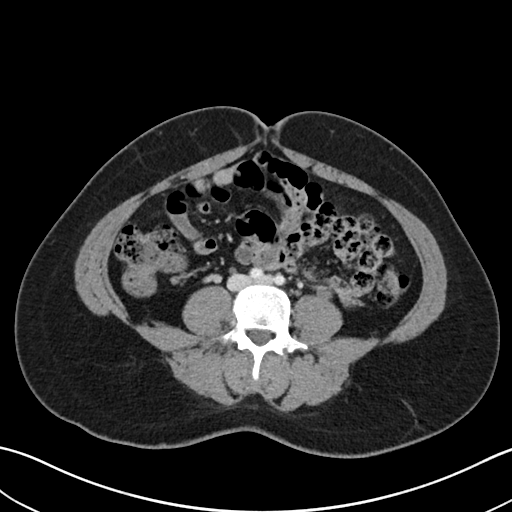
[im 52/89  soft-tissue]
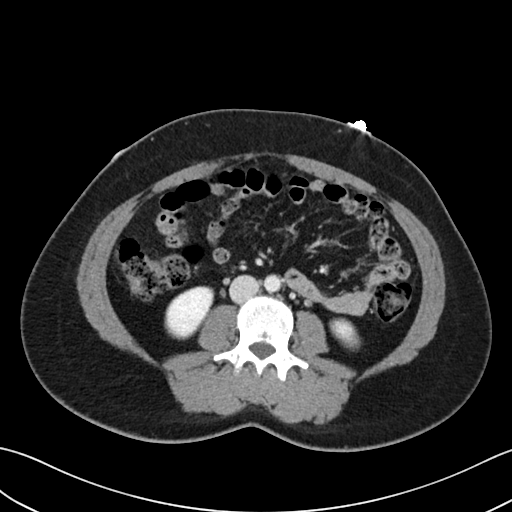
[im 59/89  soft-tissue]
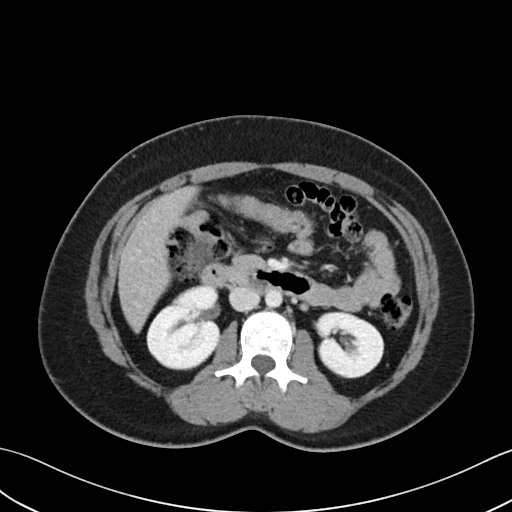
[im 59/89  bone]
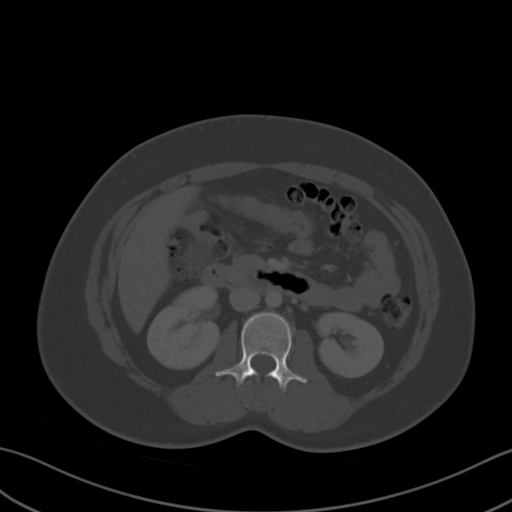
[im 63/89  soft-tissue]
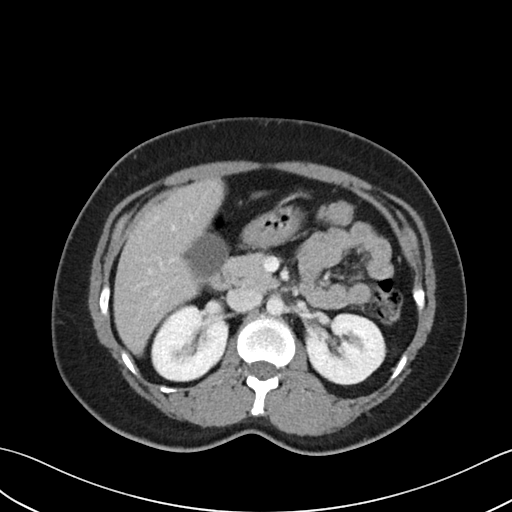
[im 70/89  soft-tissue]
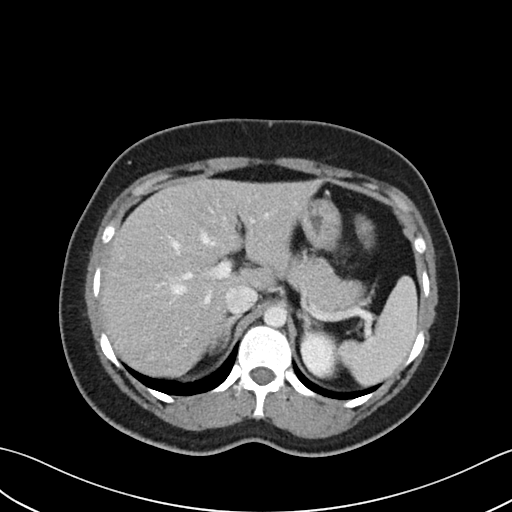
[im 78/89  soft-tissue]
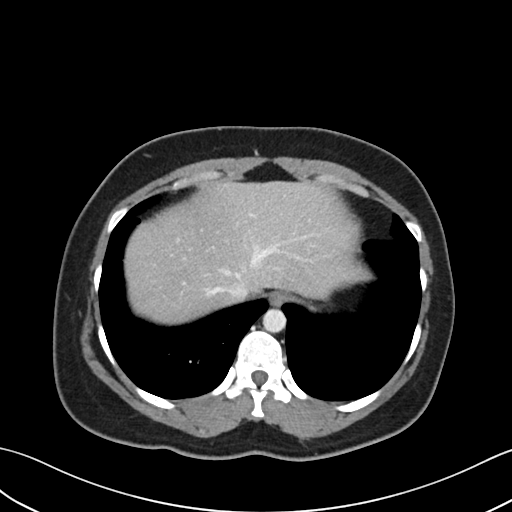
[im 85/89  soft-tissue]
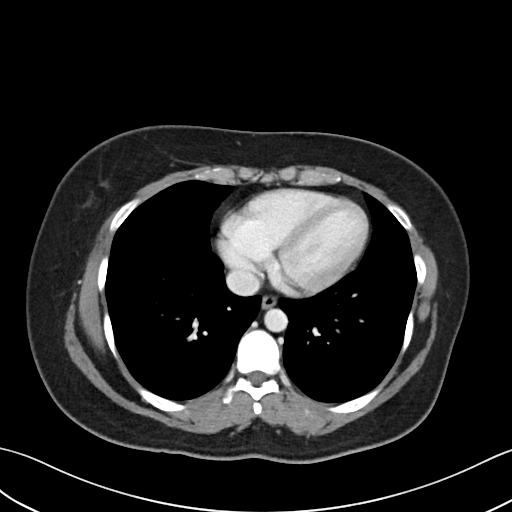

[Series 6: coronal soft tissue · coronal · 0.70mm/px · 3 of 101 slices shown]
[im 34/101  soft-tissue]
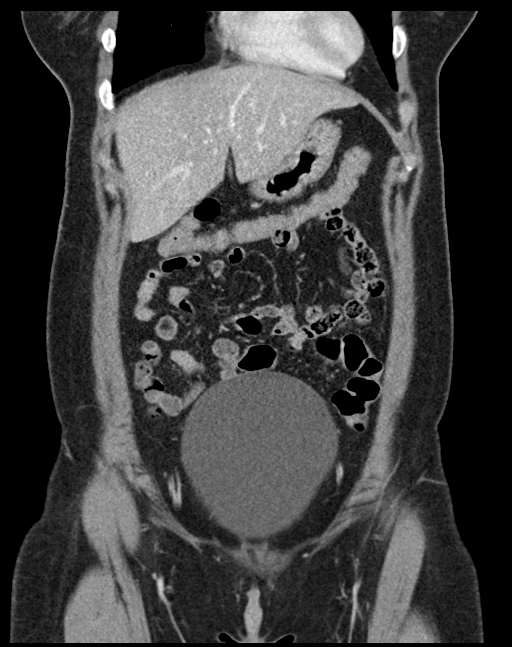
[im 45/101  soft-tissue]
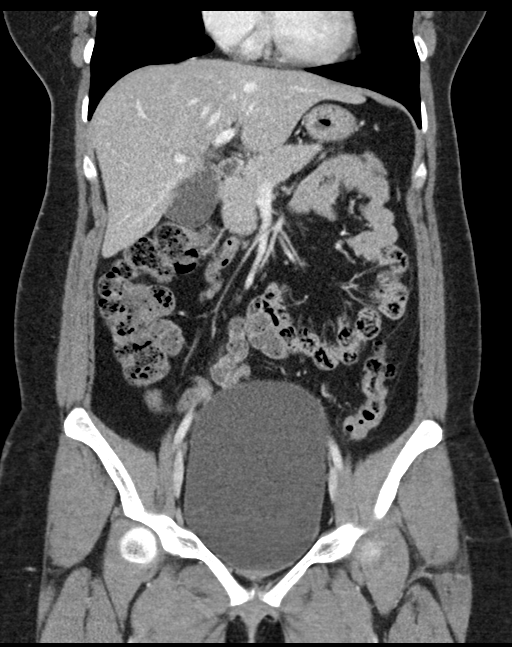
[im 56/101  soft-tissue]
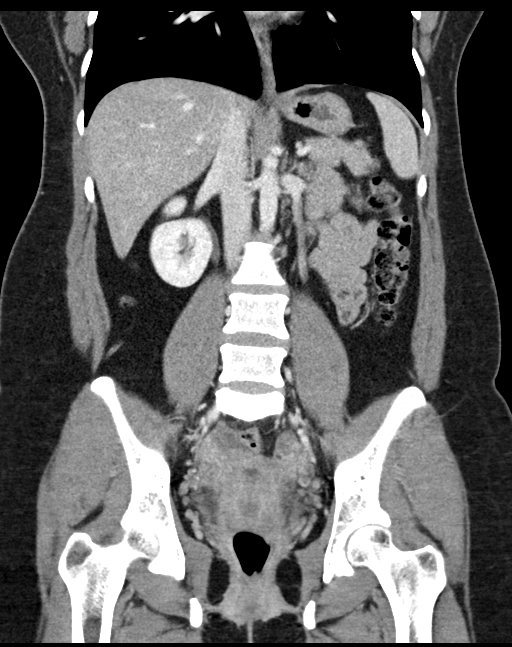

[16 of 46 positions shown; findings below may reference images not displayed]

FINDINGS: Lower chest: No acute abnormality.

Hepatobiliary: No focal hepatic mass. Small 10 mm subcapsular right
hepatic hypodensity concerning for a small subcapsular hematoma. No
hepatic laceration. No active bleeding. Mild low attenuation of the
liver as can be seen with hepatic steatosis. Normal gallbladder.

Pancreas: Unremarkable. No pancreatic ductal dilatation or
surrounding inflammatory changes.

Spleen: Normal in size without focal abnormality.

Adrenals/Urinary Tract: Adrenal glands are unremarkable. Kidneys are
normal, without renal calculi, focal lesion, or hydronephrosis.
Bladder is unremarkable.

Stomach/Bowel: Stomach is within normal limits. No evidence of bowel
wall thickening, distention, or inflammatory changes.

Vascular/Lymphatic: No significant vascular findings are present. No
enlarged abdominal or pelvic lymph nodes.

Reproductive: Uterus and bilateral adnexa are unremarkable.

Other: No abdominal wall hernia or abnormality. No abdominopelvic
ascites.

Musculoskeletal: No acute or significant osseous findings.
IMPRESSION: 1. Small 10 mm subcapsular right hepatic hypodensity concerning for
a small subcapsular hematoma. No hepatic laceration. No active
bleeding.
2. Otherwise, no acute injury of the abdomen or pelvis.

## 2020-11-12 ENCOUNTER — Ambulatory Visit: Payer: Self-pay | Admitting: Internal Medicine

## 2021-06-25 ENCOUNTER — Telehealth: Payer: Self-pay

## 2021-06-25 NOTE — Telephone Encounter (Signed)
Pt called because she is 10 days late on her period. She has been feeling nauseous, some vomiting, and pressure on her stomach. She already did a pregnancy test and it was negative. Pt has had tubal ligation so she does not suspect pregnancy. She is not taking medication for this issue. Want to know what she should do.

## 2021-06-26 ENCOUNTER — Ambulatory Visit (HOSPITAL_COMMUNITY)
Admission: EM | Admit: 2021-06-26 | Discharge: 2021-06-26 | Disposition: A | Discharge status: Home / Self Care | Payer: Self-pay | Attending: Medical Oncology | Admitting: Medical Oncology

## 2021-06-26 ENCOUNTER — Other Ambulatory Visit: Payer: Self-pay

## 2021-06-26 DIAGNOSIS — N926 Irregular menstruation, unspecified: Secondary | ICD-10-CM | POA: Insufficient documentation

## 2021-06-26 DIAGNOSIS — Z3202 Encounter for pregnancy test, result negative: Secondary | ICD-10-CM | POA: Insufficient documentation

## 2021-06-26 DIAGNOSIS — R102 Pelvic and perineal pain: Secondary | ICD-10-CM | POA: Insufficient documentation

## 2021-06-26 LAB — POCT URINALYSIS DIPSTICK, ED / UC
Bilirubin Urine: NEGATIVE
Glucose, UA: NEGATIVE mg/dL
Ketones, ur: NEGATIVE mg/dL
Nitrite: NEGATIVE
Protein, ur: NEGATIVE mg/dL
Specific Gravity, Urine: 1.02 (ref 1.005–1.030)
Urobilinogen, UA: 0.2 mg/dL (ref 0.0–1.0)
pH: 6 (ref 5.0–8.0)

## 2021-06-26 LAB — POC URINE PREG, ED: Preg Test, Ur: NEGATIVE

## 2021-06-26 NOTE — ED Triage Notes (Signed)
Pt reports she is 13 days late in her menstrual period. Pt reports 2 negative pregnancy test at home. Reports nausea and feeling bloated x 3 days.

## 2021-06-26 NOTE — Telephone Encounter (Signed)
Encourage her to drink clear liquids--no dairy products for now and eat a bland diet--crackers, chicken noodle soup, rice, toast, etc.  If she is not improving by next week or is unable to keep anything down or abdominal pain worsens, to come in and be seen as an acute.  If any new symptoms occur, to call.  Also make sure she is not having a fever.
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
## 2021-06-26 NOTE — Discharge Instructions (Addendum)
We will be in touch with your vaginal swab results if anything is positive we will treat you for that.  I do recommend that you follow-up with your OB/GYN for further evaluation given your symptoms.

## 2021-06-27 LAB — CERVICOVAGINAL ANCILLARY ONLY
Bacterial Vaginitis (gardnerella): POSITIVE — AB
Candida Glabrata: NEGATIVE
Candida Vaginitis: NEGATIVE
Chlamydia: NEGATIVE
Comment: NEGATIVE
Comment: NEGATIVE
Comment: NEGATIVE
Comment: NEGATIVE
Comment: NEGATIVE
Comment: NORMAL
Neisseria Gonorrhea: NEGATIVE
Trichomonas: NEGATIVE

## 2021-06-27 NOTE — Telephone Encounter (Signed)
Pt notified of recommendations. Instructed to call and make an appointment (acute) if issues continue

## 2021-06-28 ENCOUNTER — Telehealth (HOSPITAL_COMMUNITY): Payer: Self-pay | Admitting: Emergency Medicine

## 2021-06-28 LAB — URINE CULTURE: Culture: NO GROWTH

## 2021-06-28 MED ORDER — METRONIDAZOLE 500 MG PO TABS: 500.0000 mg | ORAL_TABLET | Freq: Two times a day (BID) | ORAL | 0 refills | Status: DC

## 2021-12-21 ENCOUNTER — Other Ambulatory Visit: Payer: Self-pay

## 2021-12-21 ENCOUNTER — Ambulatory Visit (HOSPITAL_COMMUNITY)
Admission: EM | Admit: 2021-12-21 | Discharge: 2021-12-21 | Disposition: A | Discharge status: Home / Self Care | Payer: Self-pay | Attending: Urgent Care | Admitting: Urgent Care

## 2021-12-21 ENCOUNTER — Encounter (HOSPITAL_COMMUNITY): Payer: Self-pay | Admitting: *Deleted

## 2021-12-21 DIAGNOSIS — J209 Acute bronchitis, unspecified: Secondary | ICD-10-CM

## 2021-12-21 MED ORDER — PREDNISONE 10 MG PO TABS: ORAL_TABLET | ORAL | 0 refills | Status: DC

## 2021-12-21 MED ORDER — METHYLPREDNISOLONE SODIUM SUCC 125 MG IJ SOLR
80.0000 mg | Freq: Once | INTRAMUSCULAR | Status: AC
  Administered 2021-12-21: 80 mg via INTRAMUSCULAR

## 2021-12-21 MED ORDER — MONTELUKAST SODIUM 10 MG PO TABS: 10.0000 mg | ORAL_TABLET | Freq: Every day | ORAL | 0 refills | Status: DC

## 2021-12-21 MED ORDER — METHYLPREDNISOLONE SODIUM SUCC 125 MG IJ SOLR
INTRAMUSCULAR | Status: AC
  Filled 2021-12-21: qty 2

## 2021-12-21 NOTE — ED Triage Notes (Signed)
C/O cough x 1 wk; describes occasional wheezing.  Denies fever.  C/O chest discomfort, worse with deep breathing and coughing.

## 2021-12-21 NOTE — Discharge Instructions (Addendum)
Your symptoms are consistent with acute tracheobronchitis.  This is an inflammatory condition of the upper airway. It is caused by a virus. This is best treated with steroids and antihistamines. Do not start your oral prednisone until tomorrow since you were given a steroid shot in our office.  You may start the montelukast this evening. You may also steam up a shower and breathe in the moist warm air. A warm mist vaporizer on your nightstand may help at night. Open up the freezer door and breathe in the cool air.
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
## 2022-10-25 ENCOUNTER — Ambulatory Visit (HOSPITAL_COMMUNITY)
Admission: EM | Admit: 2022-10-25 | Discharge: 2022-10-25 | Disposition: A | Discharge status: Home / Self Care | Payer: Self-pay

## 2022-10-25 ENCOUNTER — Encounter (HOSPITAL_COMMUNITY): Payer: Self-pay | Admitting: *Deleted

## 2022-10-25 DIAGNOSIS — J069 Acute upper respiratory infection, unspecified: Secondary | ICD-10-CM | POA: Insufficient documentation

## 2022-10-25 DIAGNOSIS — J029 Acute pharyngitis, unspecified: Secondary | ICD-10-CM | POA: Insufficient documentation

## 2022-10-25 DIAGNOSIS — R051 Acute cough: Secondary | ICD-10-CM | POA: Insufficient documentation

## 2022-10-25 LAB — POCT RAPID STREP A, ED / UC: Streptococcus, Group A Screen (Direct): NEGATIVE

## 2022-10-25 MED ORDER — IBUPROFEN 600 MG PO TABS: 600.0000 mg | ORAL_TABLET | Freq: Four times a day (QID) | ORAL | 0 refills | Status: DC | PRN

## 2022-10-25 MED ORDER — KETOROLAC TROMETHAMINE 30 MG/ML IJ SOLN
INTRAMUSCULAR | Status: AC
  Filled 2022-10-25: qty 1

## 2022-10-25 MED ORDER — DM-GUAIFENESIN ER 30-600 MG PO TB12: 1.0000 | ORAL_TABLET | Freq: Two times a day (BID) | ORAL | 0 refills | Status: DC

## 2022-10-25 MED ORDER — KETOROLAC TROMETHAMINE 30 MG/ML IJ SOLN
30.0000 mg | Freq: Once | INTRAMUSCULAR | Status: AC
  Administered 2022-10-25: 30 mg via INTRAMUSCULAR
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
## 2022-10-25 NOTE — Discharge Instructions (Addendum)
Advised to continue to take ibuprofen 600 mg every 8 hours on a regular basis with food to help reduce pain of the throat. Advised to use salt water gargles and lozenges on a regular basis to help soothe the pain of the sore throat. Advised take Mucinex DM every 12 hours to help control cough and chest congestion. Advised follow-up PCP or return to urgent care if symptoms fail to improve.

## 2022-10-25 NOTE — ED Triage Notes (Signed)
Pt states that she has a sore throat, congestion and some coughing X 3 days. She has been taking IBU without relief.

## 2022-10-27 LAB — CULTURE, GROUP A STREP (THRC)

## 2022-11-13 ENCOUNTER — Ambulatory Visit: Payer: Self-pay | Admitting: Internal Medicine

## 2022-12-31 ENCOUNTER — Ambulatory Visit: Payer: Self-pay | Admitting: Internal Medicine

## 2022-12-31 VITALS — BP 130/96 | HR 84 | Resp 17 | Ht 59.0 in | Wt 181.0 lb

## 2022-12-31 DIAGNOSIS — R7303 Prediabetes: Secondary | ICD-10-CM

## 2022-12-31 DIAGNOSIS — D649 Anemia, unspecified: Secondary | ICD-10-CM

## 2022-12-31 NOTE — Progress Notes (Signed)
Subjective:    Patient ID: Janice Mccormick, female   DOB: 1991/04/22, 32 y.o.   MRN: [id number]   HPI  Steward Drone interprets  Here after a 3 year hiatus   History of anemia: At this clinic was normocytic and was very mild.  Sounds like was being seen at [facility] and was given injections.  Sounds like she was told the injections were Vitamin B12.  Describes receiving 4 injections weekly for about 1 month and the last was about 2 months ago.  Later, states she was told she had iron deficiency. She states she had fatigue prior to the injections and that improved following the treatments.  She did not return to the clinic for blood work following the injections. She does not know what her follow up was supposed to be--she did not have time with her work to follow up there.    2.  Hyperglycemia/prediabetes:  Also diagnosed in recent months at [facility].  She is not familiar with A1C term.  Started on Metformin 500 mg daily beginning of November.  She stopped taking about 20 days ago as she did not know where her sugars were.  She states her cholesterol check was okay.   Has lost 10 lbs with dietary changes and increased physical activity.  Current Meds  Medication Sig   metFORMIN (GLUCOPHAGE) 500 MG tablet Take 500 mg by mouth daily with breakfast.   Allergies  Allergen Reactions   Penicillins Rash   Past Medical History:  Diagnosis Date   Anemia    Diabetes mellitus without complication (HCC)    Environmental allergies 2018   GERD (gastroesophageal reflux disease) 2018   Clinic in [facility].  abdominal ultrasound normal   Iron deficiency anemia    due to chronic menstrual blood loss   Past Surgical History:  Procedure Laterality Date   CESAREAN SECTION  2012, 2014   CESAREAN SECTION     TUBAL LIGATION  2014   with last c section   Family History  Problem Relation Age of Onset   Diabetes Mother    Hypertension Mother    Diabetes Sister     Social History   Socioeconomic History   Marital status: Married    Spouse name: Maggie Schwalbe   Number of children: 2   Years of education: 12   Highest education level: 12th grade  Occupational History   Occupation: housewife  Tobacco Use   Smoking status: Never   Smokeless tobacco: Never  Vaping Use   Vaping status: Never Used  Substance and Sexual Activity   Alcohol use: Yes    Comment: occasionally   Drug use: Never   Sexual activity: Yes    Birth control/protection: Surgical  Other Topics Concern   Not on file  Social History Narrative   ** Merged History Encounter **       Moved from [place] to [place] beginning in 2018 Lives at home with husband and 2 daughters. Brother in Social worker lives with them at times.   Social Determinants of Health   Financial Resource Strain: Not on file  Food Insecurity: No Food Insecurity (12/17/2018)   Hunger Vital Sign    Worried About Running Out of Food in the Last Year: Never true    Ran Out of Food in the Last Year: Never true  Transportation Needs: No Transportation Needs (12/17/2018)   PRAPARE - Administrator, Civil Service (Medical): No  Lack of Transportation (Non-Medical): No  Physical Activity: Insufficiently Active (12/17/2018)   Exercise Vital Sign    Days of Exercise per Week: 3 days    Minutes of Exercise per Session: 30 min  Stress: Not on file  Social Connections: Somewhat Isolated (12/17/2018)   Social Connection and Isolation Panel [NHANES]    Frequency of Communication with Friends and Family: More than three times a week    Frequency of Social Gatherings with Friends and Family: More than three times a week    Attends Religious Services: Never    Database administrator or Organizations: No    Attends Banker Meetings: Never    Marital Status: Married  Catering manager Violence: Not At Risk (12/17/2018)   Humiliation, Afraid, Rape, and Kick questionnaire    Fear of Current or Ex-Partner: No     Emotionally Abused: No    Physically Abused: No    Sexually Abused: No      Review of Systems    Objective:   BP (!) 130/96 (BP Location: Right Arm, Patient Position: Sitting, Cuff Size: Normal)   Pulse 84   Resp 17   Ht 4\' 11"  (1.499 m)   Wt 181 lb (82.1 kg)   LMP 11/28/2022 (Approximate)   BMI 36.56 kg/m   Physical Exam NAD, obese Neck:  Supple, No adenopathy, no thyromegaly Lungs:  CTA CV:  RRR with normal S1 and S2, No S3,  S4 or murmur.  Carotid, radial and DP pulses normal and equal Abd:  S, NT, No HSM or mass, + BS LE:  No edema.   Assessment & Plan    Anemia:  CBC, Vitamin B12 and iron studies.  Not clear what cause of anemia was, though history of iron deficiency anemia.  Does have history of heavy periods at times.    2.  Prediabetes:  A1C.  see if can get records from [facility].   Discussed diet and physical activity to continue to lose weight.   3.  Elevated BP:  follow up in 4 months with CPE.    60 minutes face to face today

## 2023-01-01 LAB — IRON AND TIBC
Iron Saturation: 11 % — ABNORMAL LOW (ref 15–55)
Iron: 44 ug/dL (ref 27–159)
Total Iron Binding Capacity: 398 ug/dL (ref 250–450)
UIBC: 354 ug/dL (ref 131–425)

## 2023-01-01 LAB — CBC WITH DIFFERENTIAL/PLATELET
Basophils Absolute: 0 10*3/uL (ref 0.0–0.2)
Basos: 0 %
EOS (ABSOLUTE): 0.1 10*3/uL (ref 0.0–0.4)
Eos: 1 %
Hematocrit: 37.2 % (ref 34.0–46.6)
Hemoglobin: 12.4 g/dL (ref 11.1–15.9)
Immature Grans (Abs): 0 10*3/uL (ref 0.0–0.1)
Immature Granulocytes: 0 %
Lymphocytes Absolute: 3.5 10*3/uL — ABNORMAL HIGH (ref 0.7–3.1)
Lymphs: 29 %
MCH: 30.2 pg (ref 26.6–33.0)
MCHC: 33.3 g/dL (ref 31.5–35.7)
MCV: 91 fL (ref 79–97)
Monocytes Absolute: 0.7 10*3/uL (ref 0.1–0.9)
Monocytes: 6 %
Neutrophils Absolute: 7.6 10*3/uL — ABNORMAL HIGH (ref 1.4–7.0)
Neutrophils: 64 %
Platelets: 390 10*3/uL (ref 150–450)
RBC: 4.1 x10E6/uL (ref 3.77–5.28)
RDW: 11.8 % (ref 11.7–15.4)
WBC: 12 10*3/uL — ABNORMAL HIGH (ref 3.4–10.8)

## 2023-01-01 LAB — VITAMIN B12: Vitamin B-12: 660 pg/mL (ref 232–1245)

## 2023-01-01 LAB — HGB A1C W/O EAG: Hgb A1c MFr Bld: 6.1 % — ABNORMAL HIGH (ref 4.8–5.6)

## 2023-01-02 MED ORDER — METFORMIN HCL ER 500 MG PO TB24: ORAL_TABLET | ORAL | 11 refills | Status: DC

## 2023-01-02 MED ORDER — FERROUS GLUCONATE 324 (38 FE) MG PO TABS: ORAL_TABLET | ORAL | 3 refills | Status: DC

## 2023-01-05 ENCOUNTER — Ambulatory Visit: Payer: Self-pay | Admitting: Internal Medicine

## 2023-01-06 NOTE — Progress Notes (Signed)
Pt notified 

## 2023-03-10 ENCOUNTER — Other Ambulatory Visit: Payer: Self-pay

## 2023-03-10 DIAGNOSIS — D500 Iron deficiency anemia secondary to blood loss (chronic): Secondary | ICD-10-CM

## 2023-03-11 LAB — IRON AND TIBC
Iron Saturation: 18 % (ref 15–55)
Iron: 71 ug/dL (ref 27–159)
Total Iron Binding Capacity: 390 ug/dL (ref 250–450)
UIBC: 319 ug/dL (ref 131–425)

## 2023-04-18 ENCOUNTER — Other Ambulatory Visit: Payer: Self-pay

## 2023-04-18 ENCOUNTER — Encounter (HOSPITAL_COMMUNITY): Payer: Self-pay | Admitting: Emergency Medicine

## 2023-04-18 ENCOUNTER — Ambulatory Visit (HOSPITAL_COMMUNITY)
Admission: EM | Admit: 2023-04-18 | Discharge: 2023-04-18 | Disposition: A | Discharge status: Home / Self Care | Payer: Self-pay | Attending: Physician Assistant | Admitting: Physician Assistant

## 2023-04-18 DIAGNOSIS — R0789 Other chest pain: Secondary | ICD-10-CM

## 2023-04-18 DIAGNOSIS — M542 Cervicalgia: Secondary | ICD-10-CM

## 2023-04-18 HISTORY — DX: Type 2 diabetes mellitus without complications: E11.9

## 2023-04-18 MED ORDER — METHOCARBAMOL 500 MG PO TABS: 500.0000 mg | ORAL_TABLET | Freq: Two times a day (BID) | ORAL | 0 refills | Status: DC

## 2023-04-18 MED ORDER — PREDNISONE 20 MG PO TABS: 40.0000 mg | ORAL_TABLET | Freq: Every day | ORAL | 0 refills | Status: AC

## 2023-04-18 MED ORDER — LIDOCAINE 5 % EX PTCH: 1.0000 | MEDICATED_PATCH | CUTANEOUS | 0 refills | Status: DC

## 2023-04-18 NOTE — Discharge Instructions (Signed)
Creo que tiene Sport and exercise psychologist que causa sus sntomas. Trate de evitar actividades extenuantes, incluido el levantamiento de objetos pesados. Tome prednisona 40 mg durante 4 das. No tome AINE con este medicamento, incluidos aspirina, ibuprofeno/Advil, naproxeno/Aleve. Tome Robaxin ONEOK veces al da. Esto le provocar sueo, as que no conduzca ni beba alcohol mientras lo est tomando. Si vas a realizar [employer], tmalo por la noche. Utilice parches de Tourist information centre manager. Qutelos por la noche durante un mnimo de 12 horas; use solo 1 parche cada 24 horas. Utilice calor y estiramientos suaves para [employer] sntomas. Si sus sntomas no mejoran rpidamente, realice un seguimiento con medicina deportiva, ya que puede beneficiarse de la fisioterapia que ellos pueden ayudarlo a Chief Strategy Officer. Si tiene algn sntoma que empeora, como dolor intenso, debilidad, entumecimiento o sensacin de hormigueo en las manos, debe ser atendido de inmediato.

## 2023-04-18 NOTE — ED Triage Notes (Signed)
Back pain started 3 days ago.  Patient points to right side of spine, mid to upper.  At base of neck os a "full area" that is sore.  This spot has had chronically.  Patient is right handed.  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  Patient has chest soreness with deep breathing that started yesterday.  Patient feels like it is muscles that are sore.    Patient has not had any medications for symptoms.    Patient declined Nurse, learning disability, child at bedside
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
## 2023-05-05 ENCOUNTER — Encounter: Payer: Self-pay | Admitting: Internal Medicine

## 2023-07-13 ENCOUNTER — Encounter (HOSPITAL_COMMUNITY): Payer: Self-pay | Admitting: *Deleted

## 2023-07-13 ENCOUNTER — Emergency Department (HOSPITAL_COMMUNITY)
Admission: EM | Admit: 2023-07-13 | Discharge: 2023-07-13 | Disposition: A | Discharge status: Home / Self Care | Payer: Self-pay | Attending: Emergency Medicine | Admitting: Emergency Medicine

## 2023-07-13 ENCOUNTER — Ambulatory Visit (HOSPITAL_COMMUNITY)
Admission: EM | Admit: 2023-07-13 | Discharge: 2023-07-13 | Disposition: A | Discharge status: Home / Self Care | Payer: Self-pay | Attending: Family Medicine | Admitting: Family Medicine

## 2023-07-13 ENCOUNTER — Other Ambulatory Visit: Payer: Self-pay

## 2023-07-13 ENCOUNTER — Encounter (HOSPITAL_COMMUNITY): Payer: Self-pay | Admitting: Emergency Medicine

## 2023-07-13 DIAGNOSIS — E119 Type 2 diabetes mellitus without complications: Secondary | ICD-10-CM | POA: Insufficient documentation

## 2023-07-13 DIAGNOSIS — R1013 Epigastric pain: Secondary | ICD-10-CM

## 2023-07-13 DIAGNOSIS — R1084 Generalized abdominal pain: Secondary | ICD-10-CM

## 2023-07-13 DIAGNOSIS — R110 Nausea: Secondary | ICD-10-CM

## 2023-07-13 DIAGNOSIS — R197 Diarrhea, unspecified: Secondary | ICD-10-CM

## 2023-07-13 LAB — COMPREHENSIVE METABOLIC PANEL
ALT: 27 U/L (ref 0–44)
AST: 31 U/L (ref 15–41)
Albumin: 3.8 g/dL (ref 3.5–5.0)
Alkaline Phosphatase: 76 U/L (ref 38–126)
Anion gap: 10 (ref 5–15)
BUN: 9 mg/dL (ref 6–20)
CO2: 23 mmol/L (ref 22–32)
Calcium: 8.7 mg/dL — ABNORMAL LOW (ref 8.9–10.3)
Chloride: 101 mmol/L (ref 98–111)
Creatinine, Ser: 0.67 mg/dL (ref 0.44–1.00)
GFR, Estimated: >60 mL/min (ref >60)
Glucose, Bld: 96 mg/dL (ref 70–99)
Potassium: 3.6 mmol/L (ref 3.5–5.1)
Sodium: 134 mmol/L — ABNORMAL LOW (ref 135–145)
Total Bilirubin: 0.6 mg/dL (ref 0.3–1.2)
Total Protein: 7.8 g/dL (ref 6.5–8.1)

## 2023-07-13 LAB — URINALYSIS, ROUTINE W REFLEX MICROSCOPIC
Bacteria, UA: NONE SEEN
Bilirubin Urine: NEGATIVE
Glucose, UA: NEGATIVE mg/dL
Hgb urine dipstick: NEGATIVE
Ketones, ur: NEGATIVE mg/dL
Nitrite: NEGATIVE
Protein, ur: NEGATIVE mg/dL
Specific Gravity, Urine: 1.024 (ref 1.005–1.030)
pH: 5 (ref 5.0–8.0)

## 2023-07-13 LAB — CBC
HCT: 39.6 % (ref 36.0–46.0)
Hemoglobin: 12.9 g/dL (ref 12.0–15.0)
MCH: 31.5 pg (ref 26.0–34.0)
MCHC: 32.6 g/dL (ref 30.0–36.0)
MCV: 96.8 fL (ref 80.0–100.0)
Platelets: 352 10*3/uL (ref 150–400)
RBC: 4.09 MIL/uL (ref 3.87–5.11)
RDW: 11.9 % (ref 11.5–15.5)
WBC: 8.4 10*3/uL (ref 4.0–10.5)
nRBC: 0 % (ref 0.0–0.2)

## 2023-07-13 LAB — POCT URINE PREGNANCY: Preg Test, Ur: NEGATIVE

## 2023-07-13 LAB — POCT FASTING CBG KUC MANUAL ENTRY: POCT Glucose (KUC): 90 mg/dL (ref 70–99)

## 2023-07-13 LAB — POCT URINALYSIS DIP (MANUAL ENTRY)
Bilirubin, UA: NEGATIVE
Blood, UA: NEGATIVE
Glucose, UA: NEGATIVE mg/dL
Ketones, POC UA: NEGATIVE mg/dL
Nitrite, UA: NEGATIVE
Protein Ur, POC: 30 mg/dL — AB
Spec Grav, UA: 1.02 (ref 1.010–1.025)
Urobilinogen, UA: 0.2 E.U./dL
pH, UA: 7.5 (ref 5.0–8.0)

## 2023-07-13 LAB — PREGNANCY, URINE: Preg Test, Ur: NEGATIVE

## 2023-07-13 LAB — LIPASE, BLOOD: Lipase: 31 U/L (ref 11–51)

## 2023-07-13 MED ORDER — ONDANSETRON HCL 4 MG PO TABS: 4.0000 mg | ORAL_TABLET | Freq: Four times a day (QID) | ORAL | 0 refills | Status: DC

## 2023-07-13 MED ORDER — OMEPRAZOLE 20 MG PO CPDR: 20.0000 mg | DELAYED_RELEASE_CAPSULE | Freq: Every day | ORAL | 0 refills | Status: DC

## 2023-07-13 MED ORDER — DICYCLOMINE HCL 20 MG PO TABS: 20.0000 mg | ORAL_TABLET | Freq: Two times a day (BID) | ORAL | 0 refills | Status: DC

## 2023-07-13 NOTE — ED Notes (Signed)
Patient is being discharged from the Urgent Care and sent to the Emergency Department via self . Per Dr. Tracie Harrier, patient is in need of higher level of care due to abd pain. Patient is aware and verbalizes understanding of plan of care.  Vitals:   07/13/23 0936  BP: 138/89  Pulse: 84  Resp: 16  Temp: 98.7 F (37.1 C)  SpO2: 97%

## 2023-07-13 NOTE — Discharge Instructions (Signed)
Lo atendieron en urgencias por dolor abdominal, nuseas y [place].  Como comentamos, su trabajo de laboratorio pareca tranquilizador hoy. Sospecho que sus sntomas podran estar relacionados con el sndrome del intestino irritable. Esto puede alternar entre estreimiento y [place] y, a menudo, se presenta con calambres abdominales que mejoran con las deposiciones.  Adjunto informacin al respecto as como recomendaciones de dieta. Le he enviado a su farmacia omeprazol, zofran para las nuseas y bentyl para los espasmos/calambres. Si a pesar de esto tus sntomas continan, te recomiendo acudir a Futures trader, te adjunto sus datos de contacto.  Contine controlando cmo se encuentra y regrese a la sala de emergencias si presenta sntomas nuevos o que empeoran, como un empeoramiento del dolor, vmitos o si tiene fiebre. ______________________________________________________________________ Janice Mccormick were seen in the ER for abdominal pain, nausea, and diarrhea.  As we discussed, your lab work looked reassuring today. I suspect your symptoms could be related to irritable bowel syndrome. This can alternate between constipation and diarrhea, and often presents with abdominal cramping that gets better with bowel movements.  I've attached some information about it as well as diet recommendations. I have sent omeprazole, zofran for nausea, and bentyl for spasms/cramps to your pharmacy. If your symptoms continue despite this, I recommend seeing a gastroenterologist, and I have attached their contact information.  Continue to monitor how you're doing and return to the ER for new or worsening symptoms such as worsening pain, vomiting, or if you develop a fever.

## 2023-07-13 NOTE — ED Triage Notes (Signed)
Pt sent from UC with c/o epigastric pain along with some nausea and diarrhea

## 2023-07-13 NOTE — ED Triage Notes (Signed)
Pt reports ABD pain for with diarrhea for 3 days. Pt has ABD pain after eating.

## 2023-07-13 NOTE — ED Provider Notes (Signed)
[facility] AT [facility] Provider Note   CSN: [id number] Arrival date & time: 07/13/23  1022     History  Chief Complaint  Patient presents with   Abdominal Pain    Janice Mccormick is a 32 y.o. female with history of GERD, iron deficiency anemia due to chronic heavy menses, diabetes, who presents to the ER complaining of epigastric pain. Went to UC this AM and sent to ER for evaluation. Complaining of abdominal pain after eating with nausea and diarrhea x 3 days.  States that her symptoms improved after she has a bowel movement.  She used to take lots of NSAIDs, but has stopped this.  She did try taking some Pepto-Bismol and Alka-Seltzer, only had mild relief.  Hx of C-sections and tubal ligation, no other abdominal surgeries.  She does report having previously been seen for similar symptoms in [place], and was told that her intestines were irritated at the time.  She was previously on omeprazole, but she was able to lose 20 pounds, and all her GI symptoms had resolved.  At that time she had stopped taking the omeprazole.  She has been introducing foods that she used to eat, including fatty foods.  This is when she felt her symptoms all started again.  She denies any fever, chills, vomiting, bloody bowel movements, urinary symptoms, vaginal discharge or bleeding.  The history is provided by the patient. The history is limited by a language barrier. A language interpreter was used.  Abdominal Pain Associated symptoms: diarrhea and nausea       Home Medications Prior to Admission medications   Medication Sig Start Date End Date Taking? Authorizing Provider  dicyclomine (BENTYL) 20 MG tablet Take 1 tablet (20 mg total) by mouth 2 (two) times daily. 07/13/23  Yes Naylah Cork T, PA-C  omeprazole (PRILOSEC) 20 MG capsule Take 1 capsule (20 mg total) by mouth daily. 07/13/23  Yes Lashundra Shiveley T, PA-C  ondansetron (ZOFRAN) 4 MG tablet Take 1 tablet  (4 mg total) by mouth every 6 (six) hours. 07/13/23  Yes Caleb Decock T, PA-C  ferrous gluconate (FERGON) 324 MG tablet 1 tab by mouth daily with half and orange 01/02/23   Julieanne Manson, MD  loratadine (CLARITIN) 10 MG tablet Take 1 tablet (10 mg total) by mouth daily. 01/20/20 06/05/20  Julieanne Manson, MD      Allergies    Ibuprofen and Penicillins    Review of Systems   Review of Systems  Gastrointestinal:  Positive for abdominal pain, diarrhea and nausea.  All other systems reviewed and are negative.   Physical Exam Updated Vital Signs BP 112/86 (BP Location: Right Arm)   Pulse 90   Temp 98 F (36.7 C) (Oral)   Resp 16   LMP  (LMP Unknown)   SpO2 100%  Physical Exam Vitals and nursing note reviewed.  Constitutional:      Appearance: Normal appearance.  HENT:     Head: Normocephalic and atraumatic.  Eyes:     Conjunctiva/sclera: Conjunctivae normal.  Cardiovascular:     Rate and Rhythm: Normal rate and regular rhythm.  Pulmonary:     Effort: Pulmonary effort is normal. No respiratory distress.     Breath sounds: Normal breath sounds.  Abdominal:     General: There is no distension.     Palpations: Abdomen is soft.     Tenderness: There is generalized abdominal tenderness. There is no guarding or rebound.  Skin:  General: Skin is warm and dry.  Neurological:     General: No focal deficit present.     Mental Status: She is alert.     ED Results / Procedures / Treatments   Labs (all labs ordered are listed, but only abnormal results are displayed) Labs Reviewed  COMPREHENSIVE METABOLIC PANEL - Abnormal; Notable for the following components:      Result Value   Sodium 134 (*)    Calcium 8.7 (*)    All other components within normal limits  URINALYSIS, ROUTINE W REFLEX MICROSCOPIC - Abnormal; Notable for the following components:   APPearance HAZY (*)    Leukocytes,Ua LARGE (*)    All other components within normal limits  LIPASE, BLOOD  CBC   PREGNANCY, URINE  POC URINE PREG, ED    EKG EKG Interpretation Date/Time:  Monday July 13 2023 12:30:33 EDT Ventricular Rate:  88 PR Interval:  138 QRS Duration:  78 QT Interval:  364 QTC Calculation: 440 R Axis:   96  Text Interpretation: Normal sinus rhythm with sinus arrhythmia Rightward axis Cannot rule out Anterior infarct , age undetermined Abnormal ECG No old tracing to compare Confirmed by Melene Plan [phone]) on 07/13/2023 12:31:53 PM  Radiology No results found.  Procedures Procedures    Medications Ordered in ED Medications - No data to display  ED Course/ Medical Decision Making/ A&P                             Medical Decision Making Amount and/or Complexity of Data Reviewed Labs: ordered.   This patient is a 32 y.o. female  who presents to the ED for concern of abdominal pain x 3 days.   Differential diagnoses prior to evaluation: The emergent differential diagnosis includes, but is not limited to,  AAA, mesenteric ischemia, appendicitis, diverticulitis, DKA, gastroenteritis, nephrolithiasis, pancreatitis, constipation, UTI, bowel obstruction, biliary disease, IBD, PUD, hepatitis, ectopic pregnancy, ovarian torsion, PID. This is not an exhaustive differential.   Past Medical History / Co-morbidities / Social History: GERD, iron deficiency anemia due to chronic heavy menses, diabetes  Physical Exam: Physical exam performed. The pertinent findings include: Normal vital signs, no acute distress.  Generalized abdominal tenderness to palpation, without focal findings.  No guarding or rebound.  Lab Tests/Imaging studies: I personally interpreted labs/imaging and the pertinent results include: No leukocytosis, normal hemoglobin.  CMP unremarkable.  Normal lipase.  Negative pregnancy.  Urinalysis with large leukocytes, no bacteria.  As patient has a benign abdominal exam, with reassuring laboratory evaluation, will defer abdominal imaging at this time.  Cardiac  monitoring: EKG obtained and interpreted by myself and attending physician which shows: NSR with sinus arrhythmia   Disposition: After consideration of the diagnostic results and the patients response to treatment, I feel that emergency department workup does not suggest an emergent condition requiring admission or immediate intervention beyond what has been performed at this time. The plan is: Discharge home with symptomatic management of abdominal pain, nausea without vomiting, and intermittent diarrhea, likely of benign origin. No emergent etiology found for symptoms today, nonsurgical abdominal exam, not septic.  Suspect patient could have component of irritable bowel syndrome, it seems as though she has been told about this diagnosis previously.  Will prescribe omeprazole, Zofran, and Bentyl.  Will recommend follow-up with GI if symptoms do not improve.  The patient is safe for discharge and has been instructed to return immediately for worsening symptoms, change in  symptoms or any other concerns.  Final Clinical Impression(s) / ED Diagnoses Final diagnoses:  Generalized abdominal pain  Nausea without vomiting  Intermittent diarrhea    Rx / DC Orders ED Discharge Orders          Ordered    dicyclomine (BENTYL) 20 MG tablet  2 times daily        07/13/23 1316    ondansetron (ZOFRAN) 4 MG tablet  Every 6 hours        07/13/23 1316    omeprazole (PRILOSEC) 20 MG capsule  Daily        07/13/23 1316           Portions of this report may have been transcribed using voice recognition software. Every effort was made to ensure accuracy; however, inadvertent computerized transcription errors may be present.    Su Monks, PA-C 07/13/23 1351    Melene Plan, DO 07/13/23 1505

## 2023-07-15 NOTE — ED Provider Notes (Signed)
[facility]   [id number] 07/13/23 Arrival Time: 5366  ASSESSMENT & PLAN:  1. Epigastric pain    Unclear etiology; after discussion, prefers ED evaluation. Stable upon d/c.  Reviewed expectations re: course of current medical issues. Questions answered. Outlined signs and symptoms indicating need for more acute intervention. Patient verbalized understanding. After Visit Summary given.   SUBJECTIVE: History from: patient. Janice Mccormick is a 32 y.o. female who presents with complaint of epigastric abd pain with non-bloody diarrhea; x 3 days. Pain specifically after eating. Mild nausea without emesis. Pepto without relief. Denies fever. Normal urination.  No LMP recorded (lmp unknown). (Menstrual status: Other). Past Surgical History:  Procedure Laterality Date   CESAREAN SECTION  2012, 2014   CESAREAN SECTION     TUBAL LIGATION  2014   with last c section     OBJECTIVE:  Vitals:   07/13/23 0936  BP: 138/89  Pulse: 84  Resp: 16  Temp: 98.7 F (37.1 C)  SpO2: 97%    General appearance: alert, oriented, no acute distress but appears uncomfortable HEENT: [place]; AT; oropharynx dry Lungs: unlabored respirations Abdomen: soft; without distention; moderate epigastric to lower abd TTP; with  bowel sounds; without masses or organomegaly; without guarding or rebound tenderness Back: without reported CVA tenderness; FROM at waist Extremities: without LE edema; symmetrical; without gross deformities Skin: warm and dry Neurologic: normal gait Psychological: alert and cooperative; normal mood and affect  Labs: Results for orders placed or performed during the hospital encounter of 07/13/23  POC urinalysis dipstick  Result Value Ref Range   Color, UA yellow yellow   Clarity, UA clear clear   Glucose, UA negative negative mg/dL   Bilirubin, UA negative negative   Ketones, POC UA negative negative mg/dL   Spec Grav, UA 4.403 4.742 - 1.025   Blood, UA negative  negative   pH, UA 7.5 5.0 - 8.0   Protein Ur, POC =30 (A) negative mg/dL   Urobilinogen, UA 0.2 0.2 or 1.0 E.U./dL   Nitrite, UA Negative Negative   Leukocytes, UA Small (1+) (A) Negative  POCT urine pregnancy  Result Value Ref Range   Preg Test, Ur Negative Negative  POC CBG monitoring  Result Value Ref Range   POCT Glucose ([facility]) 90 70 - 99 mg/dL   Labs Reviewed  POCT URINALYSIS DIP (MANUAL ENTRY) - Abnormal; Notable for the following components:      Result Value   Protein Ur, POC =30 (*)    Leukocytes, UA Small (1+) (*)    All other components within normal limits  POCT URINE PREGNANCY  POCT FASTING CBG [facility] MANUAL ENTRY    Imaging: No results found.   Allergies  Allergen Reactions   Ibuprofen Itching   Penicillins Rash                                               Past Medical History:  Diagnosis Date   Anemia    Diabetes mellitus without complication (HCC)    Environmental allergies 2018   GERD (gastroesophageal reflux disease) 2018   Clinic in [facility] [place].  abdominal ultrasound normal   Iron deficiency anemia    due to chronic menstrual blood loss    Social History   Socioeconomic History   Marital status: Married    Spouse name: Maggie Schwalbe   Number of  children: 2   Years of education: 12   Highest education level: 12th grade  Occupational History   Occupation: housewife  Tobacco Use   Smoking status: Never   Smokeless tobacco: Never  Vaping Use   Vaping status: Never Used  Substance and Sexual Activity   Alcohol use: Yes    Comment: occasionally   Drug use: Never   Sexual activity: Yes    Birth control/protection: Surgical  Other Topics Concern   Not on file  Social History Narrative   ** Merged History Encounter **       Moved from [place] to [place] beginning in 2018 Lives at home with husband and 2 daughters. Brother in Social worker lives with them at times.   Social Determinants of Health   Financial Resource Strain: Not on file  Food  Insecurity: No Food Insecurity (12/17/2018)   Hunger Vital Sign    Worried About Running Out of Food in the Last Year: Never true    Ran Out of Food in the Last Year: Never true  Transportation Needs: No Transportation Needs (12/17/2018)   PRAPARE - Administrator, Civil Service (Medical): No    Lack of Transportation (Non-Medical): No  Physical Activity: Insufficiently Active (12/17/2018)   Exercise Vital Sign    Days of Exercise per Week: 3 days    Minutes of Exercise per Session: 30 min  Stress: Not on file  Social Connections: Somewhat Isolated (12/17/2018)   Social Connection and Isolation Panel [NHANES]    Frequency of Communication with Friends and Family: More than three times a week    Frequency of Social Gatherings with Friends and Family: More than three times a week    Attends Religious Services: Never    Database administrator or Organizations: No    Attends Banker Meetings: Never    Marital Status: Married  Catering manager Violence: Not At Risk (12/17/2018)   Humiliation, Afraid, Rape, and Kick questionnaire    Fear of Current or Ex-Partner: No    Emotionally Abused: No    Physically Abused: No    Sexually Abused: No    Family History  Problem Relation Age of Onset   Diabetes Mother    Hypertension Mother    Diabetes Sister      Mardella Layman, MD 07/15/23 1510

## 2023-07-23 ENCOUNTER — Other Ambulatory Visit: Payer: Self-pay

## 2023-08-05 ENCOUNTER — Encounter: Payer: Self-pay | Admitting: Internal Medicine

## 2023-08-06 ENCOUNTER — Ambulatory Visit (HOSPITAL_COMMUNITY)
Admission: EM | Admit: 2023-08-06 | Discharge: 2023-08-06 | Disposition: A | Discharge status: Home / Self Care | Payer: Self-pay | Attending: Physician Assistant | Admitting: Physician Assistant

## 2023-08-06 ENCOUNTER — Encounter (HOSPITAL_COMMUNITY): Payer: Self-pay

## 2023-08-06 DIAGNOSIS — J029 Acute pharyngitis, unspecified: Secondary | ICD-10-CM

## 2023-08-06 LAB — POCT RAPID STREP A (OFFICE): Rapid Strep A Screen: NEGATIVE

## 2023-08-06 MED ORDER — DEXAMETHASONE SODIUM PHOSPHATE 10 MG/ML IJ SOLN
INTRAMUSCULAR | Status: AC
  Filled 2023-08-06: qty 1

## 2023-08-06 MED ORDER — DEXAMETHASONE SODIUM PHOSPHATE 10 MG/ML IJ SOLN
10.0000 mg | Freq: Once | INTRAMUSCULAR | Status: AC
  Administered 2023-08-06: 10 mg via INTRAMUSCULAR

## 2023-08-06 NOTE — Discharge Instructions (Signed)
Tylenol for discomfort.  Warm salt water gargles. Lozenges

## 2023-08-06 NOTE — ED Triage Notes (Signed)
Patient here today with c/o ST, coughing up phlegm, nasal congestion, wheeze, and hoarseness X 2 days. ST is worse in the night. She has been taking a cold medication OTC with very little improvement.
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
## 2023-08-16 DIAGNOSIS — R87612 Low grade squamous intraepithelial lesion on cytologic smear of cervix (LGSIL): Secondary | ICD-10-CM

## 2023-08-16 HISTORY — DX: Low grade squamous intraepithelial lesion on cytologic smear of cervix (LGSIL): R87.612

## 2023-08-25 ENCOUNTER — Other Ambulatory Visit: Payer: Self-pay | Admitting: Internal Medicine

## 2023-08-25 ENCOUNTER — Ambulatory Visit: Payer: Self-pay | Admitting: Internal Medicine

## 2023-08-25 ENCOUNTER — Encounter: Payer: Self-pay | Admitting: Internal Medicine

## 2023-08-25 VITALS — BP 108/70 | HR 83 | Resp 16 | Ht 59.0 in | Wt 186.0 lb

## 2023-08-25 DIAGNOSIS — Z0000 Encounter for general adult medical examination without abnormal findings: Secondary | ICD-10-CM

## 2023-08-25 DIAGNOSIS — D649 Anemia, unspecified: Secondary | ICD-10-CM

## 2023-08-25 DIAGNOSIS — Z23 Encounter for immunization: Secondary | ICD-10-CM

## 2023-08-25 DIAGNOSIS — Z1159 Encounter for screening for other viral diseases: Secondary | ICD-10-CM

## 2023-08-25 DIAGNOSIS — Z124 Encounter for screening for malignant neoplasm of cervix: Secondary | ICD-10-CM

## 2023-08-25 DIAGNOSIS — Z9189 Other specified personal risk factors, not elsewhere classified: Secondary | ICD-10-CM

## 2023-08-25 DIAGNOSIS — E669 Obesity, unspecified: Secondary | ICD-10-CM | POA: Insufficient documentation

## 2023-08-25 DIAGNOSIS — N898 Other specified noninflammatory disorders of vagina: Secondary | ICD-10-CM

## 2023-08-25 DIAGNOSIS — R7303 Prediabetes: Secondary | ICD-10-CM | POA: Insufficient documentation

## 2023-08-25 LAB — POCT WET PREP WITH KOH
KOH Prep POC: NEGATIVE
RBC Wet Prep HPF POC: NEGATIVE
Trichomonas, UA: NEGATIVE
Yeast Wet Prep HPF POC: NEGATIVE

## 2023-08-25 NOTE — Progress Notes (Signed)
Subjective:    Patient ID: Janice Mccormick, female   DOB: 13-Feb-1991, 32 y.o.   MRN: [id number]   HPI  Tereasa Coop interprets  CPE with pap  1.  Pap:  Has not had performed for more than 3 years.  Always normal in past.  She cannot remember where last performed.    2.  Mammogram:  Never.  3 maternal cousins (2 were sisters) with history of breast cancer.  She believes diagnosed in mid 70s.  All 3 died.  Diagnosed and treated in [place].  She is uncertain as to whether genetic predisposition found.    3.  Osteoprevention:  does not currently, but willing to drink 2% milk, 3 servings daily.  Walks twice weekly.  States she does not have time to walk more frequently.    4.  Guaiac Cards/FIT:  Never.    5.  Colonoscopy:  Never.  No family history of colon cancer.    6.  Immunizations:  No influenza vaccine for past 3 years.  Never had a COVID vaccine.  Frightened of the strength of reaction to the vaccine.   Immunization History  Administered Date(s) Administered   Tdap 12/16/2015     7.  Glucose/Cholesterol:  A1C most recently 6.1% in January.  Cholesterol panel okay back in 2020.  Stopped taking Metformin on and off as she felt it caused palpitations.   Lipid Panel     Component Value Date/Time   CHOL 164 12/24/2018 0847   TRIG 52 12/24/2018 0847   HDL 40 12/24/2018 0847   LDLCALC 114 (H) 12/24/2018 0847   LABVLDL 10 12/24/2018 0847      Current Meds  Medication Sig   ferrous gluconate (FERGON) 324 MG tablet 1 tab by mouth daily with half and orange (Patient taking differently: 1 tab by mouth daily with half and orange occassionally)   Allergies  Allergen Reactions   Amoxicillin    Ibuprofen Itching   Naproxen    Penicillins Rash   Past Medical History:  Diagnosis Date   Environmental allergies 2018   GERD (gastroesophageal reflux disease) 2018   Clinic in [facility].  abdominal ultrasound normal   Iron deficiency anemia    due to chronic menstrual  blood loss   Obesity (BMI 35.0-39.9 without comorbidity)    Prediabetes    Past Surgical History:  Procedure Laterality Date   CESAREAN SECTION  2012, 2014   CESAREAN SECTION     TUBAL LIGATION  2014   with last c section   Family History  Problem Relation Age of Onset   Diabetes Mother    Hypertension Mother    Diabetes Sister    Family Status  Relation Name Status   Mother  Alive, age 9y   Father  Deceased at age 23   Sister Thayer Headings, age 57y       Patient here as well   Sister  Alive   Sister  Alive   Sister  Alive   Brother  Alive   Brother  Alive   Brother  Alive   Brother  Alive   Brother  Alive   Daughter Vance Gather, age [place]   Daughter Lamount Cohen, age 9y  No partnership data on file   Social History   Socioeconomic History   Marital status: Married    Spouse name: Maggie Schwalbe   Number of children: 2   Years of education: 12   Highest education level: 12th  grade  Occupational History   Occupation: housewife   Occupation: Human resources officer  Tobacco Use   Smoking status: Never    Passive exposure: Never   Smokeless tobacco: Never  Vaping Use   Vaping status: Never Used  Substance and Sexual Activity   Alcohol use: Not Currently   Drug use: Never   Sexual activity: Yes    Birth control/protection: Surgical  Other Topics Concern   Not on file  Social History Narrative   Moved from [place] to [place] beginning in 2018   Lives at home with husband and 2 daughters.   Social Determinants of Health   Financial Resource Strain: Low Risk  (08/25/2023)   Overall Financial Resource Strain (CARDIA)    Difficulty of Paying Living Expenses: Not very hard  Food Insecurity: No Food Insecurity (08/25/2023)   Hunger Vital Sign    Worried About Running Out of Food in the Last Year: Never true    Ran Out of Food in the Last Year: Never true  Transportation Needs: No Transportation Needs (08/25/2023)   PRAPARE - Scientist, research (physical sciences) (Medical): No    Lack of Transportation (Non-Medical): No  Physical Activity: Insufficiently Active (12/17/2018)   Exercise Vital Sign    Days of Exercise per Week: 3 days    Minutes of Exercise per Session: 30 min  Stress: Not on file  Social Connections: Somewhat Isolated (12/17/2018)   Social Connection and Isolation Panel [NHANES]    Frequency of Communication with Friends and Family: More than three times a week    Frequency of Social Gatherings with Friends and Family: More than three times a week    Attends Religious Services: Never    Database administrator or Organizations: No    Attends Banker Meetings: Never    Marital Status: Married  Catering manager Violence: Not At Risk (08/25/2023)   Humiliation, Afraid, Rape, and Kick questionnaire    Fear of Current or Ex-Partner: No    Emotionally Abused: No    Physically Abused: No    Sexually Abused: No      Review of Systems  All other systems reviewed and are negative.     Objective:   BP 108/70 (BP Location: Left Arm, Patient Position: Sitting, Cuff Size: Normal)   Pulse 83   Resp 16   Ht 4\' 11"  (1.499 m)   Wt 186 lb (84.4 kg)   LMP 08/17/2023 (Exact Date)   BMI 37.57 kg/m   Physical Exam Constitutional:      Appearance: She is obese.  HENT:     Head: Normocephalic and atraumatic.     Right Ear: Tympanic membrane, ear canal and external ear normal.     Left Ear: Tympanic membrane, ear canal and external ear normal.     Nose: Nose normal.     Mouth/Throat:     Mouth: Mucous membranes are moist.     Pharynx: Oropharynx is clear.  Eyes:     Extraocular Movements: Extraocular movements intact.     Conjunctiva/sclera: Conjunctivae normal.     Pupils: Pupils are equal, round, and reactive to light.     Comments: Discs sharp  Neck:     Thyroid: No thyroid mass or thyromegaly.  Cardiovascular:     Rate and Rhythm: Normal rate and regular rhythm.     Pulses: Normal pulses.     Heart  sounds: S1 normal and S2 normal. No murmur heard.    No  friction rub. No S3 or S4 sounds.  Pulmonary:     Effort: Pulmonary effort is normal.     Breath sounds: Normal breath sounds and air entry.  Chest:  Breasts:    Right: No inverted nipple, mass or nipple discharge.     Left: No inverted nipple, mass or nipple discharge.  Abdominal:     General: Bowel sounds are normal.     Palpations: Abdomen is soft. There is no hepatomegaly, splenomegaly or mass.     Tenderness: There is no abdominal tenderness.     Hernia: No hernia is present.     Comments: Midline surgical scar, lower abdomen  Genitourinary:    Comments: Normal external female genitalia No cervical or vaginal mucosal lesion Scant vaginal discharge No uterine or adnexal mass or tenderness.   Musculoskeletal:        General: Normal range of motion.     Cervical back: Normal range of motion and neck supple.     Right lower leg: No edema.     Left lower leg: No edema.  Lymphadenopathy:     Head:     Right side of head: No submental or submandibular adenopathy.     Left side of head: No submental or submandibular adenopathy.     Cervical: No cervical adenopathy.     Upper Body:     Right upper body: No supraclavicular or axillary adenopathy.     Left upper body: No supraclavicular or axillary adenopathy.     Lower Body: No right inguinal adenopathy. No left inguinal adenopathy.  Skin:    General: Skin is warm.     Capillary Refill: Capillary refill takes less than 2 seconds.     Findings: No lesion or rash.  Neurological:     General: No focal deficit present.     Mental Status: She is alert and oriented to person, place, and time.     Cranial Nerves: Cranial nerves 2-12 are intact.     Sensory: Sensation is intact.     Motor: Motor function is intact.     Coordination: Coordination is intact.     Gait: Gait is intact.     Deep Tendon Reflexes: Reflexes are normal and symmetric.  Psychiatric:        Speech: Speech  normal.        Behavior: Behavior normal. Behavior is cooperative.      Assessment & Plan   CPE with pap Influenza vaccine.  Encouraged COVID booster when available. CBC, CMP, FLP, hep c screen. GC/chlamydia with scant vaginal discharge, wet prep without definitive findings.  2.  Prediabetes:  A1C.  Encouraged goals for diet and physical activity.  3.  Anemia:  CBC  4.  Need for dental care:  dental referral

## 2023-08-26 LAB — CBC WITH DIFFERENTIAL/PLATELET
Basophils Absolute: 0 10*3/uL (ref 0.0–0.2)
Basos: 0 %
EOS (ABSOLUTE): 0.1 10*3/uL (ref 0.0–0.4)
Eos: 1 %
Hematocrit: 40.2 % (ref 34.0–46.6)
Hemoglobin: 13.2 g/dL (ref 11.1–15.9)
Immature Grans (Abs): 0 10*3/uL (ref 0.0–0.1)
Immature Granulocytes: 0 %
Lymphocytes Absolute: 2.4 10*3/uL (ref 0.7–3.1)
Lymphs: 30 %
MCH: 30.9 pg (ref 26.6–33.0)
MCHC: 32.8 g/dL (ref 31.5–35.7)
MCV: 94 fL (ref 79–97)
Monocytes Absolute: 0.3 10*3/uL (ref 0.1–0.9)
Monocytes: 4 %
Neutrophils Absolute: 5.1 10*3/uL (ref 1.4–7.0)
Neutrophils: 65 %
Platelets: 462 10*3/uL — ABNORMAL HIGH (ref 150–450)
RBC: 4.27 x10E6/uL (ref 3.77–5.28)
RDW: 11.8 % (ref 11.7–15.4)
WBC: 7.9 10*3/uL (ref 3.4–10.8)

## 2023-08-26 LAB — LIPID PANEL W/O CHOL/HDL RATIO
Cholesterol, Total: 175 mg/dL (ref 100–199)
HDL: 52 mg/dL (ref >39)
LDL Chol Calc (NIH): 110 mg/dL — ABNORMAL HIGH (ref 0–99)
Triglycerides: 66 mg/dL (ref 0–149)
VLDL Cholesterol Cal: 13 mg/dL (ref 5–40)

## 2023-08-26 LAB — COMPREHENSIVE METABOLIC PANEL
ALT: 21 IU/L (ref 0–32)
AST: 22 IU/L (ref 0–40)
Albumin: 4.4 g/dL (ref 3.9–4.9)
Alkaline Phosphatase: 98 IU/L (ref 44–121)
BUN/Creatinine Ratio: 18 (ref 9–23)
BUN: 10 mg/dL (ref 6–20)
Bilirubin Total: 0.4 mg/dL (ref 0.0–1.2)
CO2: 23 mmol/L (ref 20–29)
Calcium: 9.3 mg/dL (ref 8.7–10.2)
Chloride: 101 mmol/L (ref 96–106)
Creatinine, Ser: 0.57 mg/dL (ref 0.57–1.00)
Globulin, Total: 3.4 g/dL (ref 1.5–4.5)
Glucose: 88 mg/dL (ref 70–99)
Potassium: 4.4 mmol/L (ref 3.5–5.2)
Sodium: 139 mmol/L (ref 134–144)
Total Protein: 7.8 g/dL (ref 6.0–8.5)
eGFR: 122 mL/min/{1.73_m2} (ref >59)

## 2023-08-26 LAB — TSH: TSH: 1.35 u[IU]/mL (ref 0.450–4.500)

## 2023-08-26 LAB — CYTOLOGY - PAP

## 2023-08-26 LAB — HEPATITIS C ANTIBODY: Hep C Virus Ab: NONREACTIVE

## 2023-08-26 LAB — HGB A1C W/O EAG: Hgb A1c MFr Bld: 6.3 % — ABNORMAL HIGH (ref 4.8–5.6)

## 2023-08-28 LAB — GC/CHLAMYDIA PROBE AMP
Chlamydia trachomatis, NAA: NEGATIVE
Neisseria Gonorrhoeae by PCR: NEGATIVE

## 2023-09-23 ENCOUNTER — Encounter: Payer: Self-pay | Admitting: Internal Medicine

## 2023-09-26 ENCOUNTER — Encounter: Payer: Self-pay | Admitting: Internal Medicine

## 2023-09-26 ENCOUNTER — Other Ambulatory Visit: Payer: Self-pay | Admitting: Internal Medicine

## 2023-09-26 DIAGNOSIS — R87612 Low grade squamous intraepithelial lesion on cytologic smear of cervix (LGSIL): Secondary | ICD-10-CM

## 2023-11-03 ENCOUNTER — Encounter (HOSPITAL_COMMUNITY): Payer: Self-pay

## 2023-11-03 ENCOUNTER — Ambulatory Visit (HOSPITAL_COMMUNITY)
Admission: EM | Admit: 2023-11-03 | Discharge: 2023-11-03 | Disposition: A | Discharge status: Home / Self Care | Payer: Self-pay | Attending: Family Medicine | Admitting: Family Medicine

## 2023-11-03 DIAGNOSIS — Z779 Other contact with and (suspected) exposures hazardous to health: Secondary | ICD-10-CM

## 2023-11-03 DIAGNOSIS — R0602 Shortness of breath: Secondary | ICD-10-CM

## 2023-11-03 MED ORDER — BENZONATATE 100 MG PO CAPS: 100.0000 mg | ORAL_CAPSULE | Freq: Three times a day (TID) | ORAL | 0 refills | Status: DC | PRN

## 2023-11-03 MED ORDER — PREDNISONE 20 MG PO TABS: 40.0000 mg | ORAL_TABLET | Freq: Every day | ORAL | 0 refills | Status: AC

## 2023-11-03 NOTE — ED Triage Notes (Signed)
Patient here today with c/o SOB and chest pain after entering a house that had an ozone air purifier at around 6:30 pm today. Patient states that she was in the house for about 20 minutes when she started having these symptoms. Patient's daughter was with her and she is having similar symptoms.

## 2023-11-03 NOTE — Discharge Instructions (Signed)
Take prednisone 20 mg--2 daily for 5 days. [facility] prednisona 20 mg (2 mg al da durante 5 das)   Take benzonatate 100 mg, 1 tab every 8 hours as needed for cough.  [facility] 1 capsula 3 veces al dia cuando tiene tos)
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
## 2023-11-05 ENCOUNTER — Telehealth: Payer: Self-pay

## 2023-11-05 NOTE — Telephone Encounter (Signed)
Telephoned patient using interpreter, Janice Mccormick. Left voice message with [facility] contact information.

## 2023-11-24 ENCOUNTER — Other Ambulatory Visit (HOSPITAL_COMMUNITY)
Admission: RE | Admit: 2023-11-24 | Discharge: 2023-11-24 | Disposition: A | Discharge status: Home / Self Care | Payer: Self-pay | Source: Ambulatory Visit | Attending: Obstetrics and Gynecology | Admitting: Obstetrics and Gynecology

## 2023-11-24 ENCOUNTER — Ambulatory Visit: Payer: Self-pay | Admitting: Hematology and Oncology

## 2023-11-24 VITALS — BP 145/108 | Wt 184.0 lb

## 2023-11-24 DIAGNOSIS — B977 Papillomavirus as the cause of diseases classified elsewhere: Secondary | ICD-10-CM

## 2023-11-24 DIAGNOSIS — R87612 Low grade squamous intraepithelial lesion on cytologic smear of cervix (LGSIL): Secondary | ICD-10-CM

## 2023-11-24 DIAGNOSIS — Z01812 Encounter for preprocedural laboratory examination: Secondary | ICD-10-CM

## 2023-11-24 NOTE — Progress Notes (Signed)
[name] is a 32 y.o. female who presents to [facility] clinic today with no complaints.    Pap Smear: Pap not smear completed today. Last Pap smear was 08/25/2023 and was abnormal - LSIL/ HPV+ . Per patient has no history of an abnormal Pap smear. Last Pap smear result is available in Epic.   Physical exam: Breasts Breasts symmetrical. No skin abnormalities bilateral breasts. No nipple retraction bilateral breasts. No nipple discharge bilateral breasts. No lymphadenopathy. No lumps palpated bilateral breasts.       Pelvic/Bimanual Pap is not indicated today   GYNECOLOGY CLINIC COLPOSCOPY PROCEDURE NOTE  [name] is a 32 y.o. No obstetric history on file. here for colposcopy for low-grade squamous intraepithelial neoplasia (LGSIL - encompassing HPV,mild dysplasia,CIN I) pap smear on 08/25/2023. Discussed role for HPV in cervical dysplasia, need for surveillance.  Patient given informed consent, signed copy in the chart, time out was performed.  Placed in lithotomy position. Cervix viewed with speculum and colposcope after application of acetic acid.   Colposcopy adequate? Yes  no visible lesions.  ECC specimen obtained. All specimens were labelled and sent to pathology.  Patient was given post procedure instructions.  Will follow up pathology and manage accordingly.  Routine preventative health maintenance measures emphasized.   Smoking History: Patient has never smoked and was not referred to quit line.    Patient Navigation: Patient education provided. Access to services provided for patient through [facility] program. Natale Lay interpreter provided. No transportation provided   Colorectal Cancer Screening: Per patient has never had colonoscopy completed No complaints today.    Breast and Cervical Cancer Risk Assessment: Patient does not have family history of breast cancer, known genetic mutations, or radiation treatment to the chest before age 62.  Patient has history of cervical dysplasia, immunocompromised, or DES exposure in-utero.  Risk Assessment   No risk assessment data     A: [facility] exam without pap smear No complaints with benign exam. Colposcopy as above.   P: Will follow up accordingly pending pathology results. If low grade, will repeat Pap smear in one year. If high grade, will refer to gynecology for further treatment.   Pascal Lux, NP 11/24/2023 2:21 PM

## 2023-11-25 ENCOUNTER — Telehealth: Payer: Self-pay

## 2023-11-25 DIAGNOSIS — Z23 Encounter for immunization: Secondary | ICD-10-CM

## 2023-11-25 NOTE — Telephone Encounter (Signed)
Patient is interested in getting HPV vaccine. Was told at the [facility] Department that they need a referral from PCP to give her that vaccine.

## 2023-11-26 LAB — SURGICAL PATHOLOGY

## 2023-12-01 ENCOUNTER — Telehealth: Payer: Self-pay

## 2023-12-01 NOTE — Telephone Encounter (Signed)
Using Interpreter, [place], ID: [id number], with [employer], I have LVM for the pt requesting a return call to review her results. Office number has been provided.

## 2023-12-03 NOTE — Telephone Encounter (Signed)
Using spanish Interpreter, Joslyn Hy, I have left another message for the pt requesting a return call to review her results. Main office number has been provided.

## 2023-12-04 ENCOUNTER — Telehealth: Payer: Self-pay

## 2023-12-04 NOTE — Telephone Encounter (Signed)
Via Rito Ehrlich, [facility] Spanish Interpreter, Patient was informed Colpo results revealed LGSIL (low grade cells) and no malignancy. Discussed LGSIL and HPV, HPV vaccine,  all questions answered, information mailed to patient. Patient also stated that she check with [facility] regarding getting the HPV (Gardasil) vaccine was told that she would need a referral. Patient did check with her pcp, but was told they did have the vaccine available there. Referral request sent to Ilda Basset, FNP-BC. Patient is aware that request may not be handled until after the holidays.

## 2023-12-04 NOTE — Telephone Encounter (Signed)
Using spanish Interpreter, Joslyn Hy, I have left a 3rd message for the pt requesting a return call to review her results. Main office number has been provided.

## 2023-12-22 NOTE — Telephone Encounter (Signed)
 Referral was faxed and patient came in to pick up a paper copy of referral.

## 2023-12-24 ENCOUNTER — Encounter (HOSPITAL_COMMUNITY): Payer: Self-pay

## 2023-12-24 ENCOUNTER — Ambulatory Visit (HOSPITAL_COMMUNITY)
Admission: EM | Admit: 2023-12-24 | Discharge: 2023-12-24 | Disposition: A | Discharge status: Home / Self Care | Payer: Self-pay | Attending: Internal Medicine | Admitting: Internal Medicine

## 2023-12-24 DIAGNOSIS — J101 Influenza due to other identified influenza virus with other respiratory manifestations: Secondary | ICD-10-CM

## 2023-12-24 LAB — POCT RAPID STREP A (OFFICE): Rapid Strep A Screen: NEGATIVE

## 2023-12-24 MED ORDER — ACETAMINOPHEN 500 MG PO TABS: 500.0000 mg | ORAL_TABLET | Freq: Four times a day (QID) | ORAL | 0 refills | Status: DC | PRN

## 2023-12-24 MED ORDER — OSELTAMIVIR PHOSPHATE 75 MG PO CAPS: 75.0000 mg | ORAL_CAPSULE | Freq: Two times a day (BID) | ORAL | 0 refills | Status: DC
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
## 2023-12-24 NOTE — ED Triage Notes (Signed)
 Patient here today with c/o nasal congestion and left ear pain X 2 days. She has Tylenol this morning with no relief.

## 2023-12-24 NOTE — Discharge Instructions (Signed)
 Please increase oral fluid intake Your strep test is negative You may take Tylenol as needed for pain and/or fever Please take Tamiflu as directed If you have worsening symptoms please return to urgent care to be reevaluated.

## 2024-01-08 ENCOUNTER — Other Ambulatory Visit (INDEPENDENT_AMBULATORY_CARE_PROVIDER_SITE_OTHER): Payer: Self-pay

## 2024-01-08 DIAGNOSIS — J029 Acute pharyngitis, unspecified: Secondary | ICD-10-CM

## 2024-01-08 LAB — POCT RAPID STREP A (OFFICE): Rapid Strep A Screen: NEGATIVE

## 2024-01-08 LAB — POCT INFLUENZA A/B
Influenza A, POC: NEGATIVE
Influenza B, POC: NEGATIVE

## 2024-01-08 LAB — POC COVID19 BINAXNOW: SARS Coronavirus 2 Ag: NEGATIVE

## 2024-01-11 ENCOUNTER — Other Ambulatory Visit: Payer: Self-pay

## 2024-01-11 DIAGNOSIS — R7303 Prediabetes: Secondary | ICD-10-CM

## 2024-01-11 DIAGNOSIS — R7989 Other specified abnormal findings of blood chemistry: Secondary | ICD-10-CM

## 2024-01-12 LAB — CBC WITH DIFFERENTIAL/PLATELET
Basophils Absolute: 0 10*3/uL (ref 0.0–0.2)
Basos: 0 %
EOS (ABSOLUTE): 0.1 10*3/uL (ref 0.0–0.4)
Eos: 1 %
Hematocrit: 39.5 % (ref 34.0–46.6)
Hemoglobin: 12.9 g/dL (ref 11.1–15.9)
Immature Grans (Abs): 0 10*3/uL (ref 0.0–0.1)
Immature Granulocytes: 0 %
Lymphocytes Absolute: 2.5 10*3/uL (ref 0.7–3.1)
Lymphs: 24 %
MCH: 30.6 pg (ref 26.6–33.0)
MCHC: 32.7 g/dL (ref 31.5–35.7)
MCV: 94 fL (ref 79–97)
Monocytes Absolute: 0.5 10*3/uL (ref 0.1–0.9)
Monocytes: 5 %
Neutrophils Absolute: 7.6 10*3/uL — ABNORMAL HIGH (ref 1.4–7.0)
Neutrophils: 70 %
Platelets: 404 10*3/uL (ref 150–450)
RBC: 4.21 x10E6/uL (ref 3.77–5.28)
RDW: 12.1 % (ref 11.7–15.4)
WBC: 10.8 10*3/uL (ref 3.4–10.8)

## 2024-01-12 LAB — HEMOGLOBIN A1C
Est. average glucose Bld gHb Est-mCnc: 137 mg/dL
Hgb A1c MFr Bld: 6.4 % — ABNORMAL HIGH (ref 4.8–5.6)

## 2024-02-18 ENCOUNTER — Encounter: Payer: Self-pay | Admitting: Internal Medicine

## 2024-02-18 ENCOUNTER — Ambulatory Visit: Payer: Self-pay | Admitting: Internal Medicine

## 2024-02-18 VITALS — BP 134/90 | HR 63 | Resp 16 | Ht 59.0 in | Wt 188.0 lb

## 2024-02-18 DIAGNOSIS — Z889 Allergy status to unspecified drugs, medicaments and biological substances status: Secondary | ICD-10-CM

## 2024-02-18 DIAGNOSIS — J019 Acute sinusitis, unspecified: Secondary | ICD-10-CM

## 2024-02-18 DIAGNOSIS — R0981 Nasal congestion: Secondary | ICD-10-CM

## 2024-02-18 LAB — POC COVID19 BINAXNOW: SARS Coronavirus 2 Ag: NEGATIVE

## 2024-02-18 LAB — POCT INFLUENZA A/B
Influenza A, POC: NEGATIVE
Influenza B, POC: NEGATIVE

## 2024-02-18 MED ORDER — LORATADINE 10 MG PO TABS: 10.0000 mg | ORAL_TABLET | Freq: Every day | ORAL | Status: DC

## 2024-02-18 MED ORDER — AZITHROMYCIN 250 MG PO TABS
ORAL_TABLET | ORAL | 0 refills | Status: AC
Start: 2024-02-18 — End: 2024-02-23

## 2024-02-18 NOTE — Patient Instructions (Signed)
 Neti pot with distilled water at body temp once daily

## 2024-02-18 NOTE — Progress Notes (Signed)
    Subjective:    Patient ID: Janice Mccormick, female   DOB: [birth year], 33 y.o.   MRN: [id number]   HPI  Laveta Pottier interprets.    Suffered from influenza diagnosed 12/22/2023. She was treated with Tamiflu  and felt well save for intermittent hoarseness with posterior pharyngeal drainage. About a month ago, developed left ear pain, left throat hurt and eventually congestion, cough with posterior pharyngeal drainage and hoarseness.  Cough was productive of light green mucous.  No fever.   No dyspnea Her symptoms seem to wax and wane.  Has noted when exposed to cleaning powders, her symptoms seem worse.  Does home cleaning 1-2 times weekly.  She does feel her symptoms almost resolve when away from cleaning for a few days.   She does continue to have hoarseness that comes and goes with cleaning and discomfort in her left throat and left ear.  No problems with hearing.   She is taking something similar to Robitussin for pharyngeal drainage, which has helped.  No facial or eye pressure.   She does state her eyes are tired.     Current Meds  Medication Sig   acetaminophen  (TYLENOL ) 500 MG tablet Take 1 tablet (500 mg total) by mouth every 6 (six) hours as needed.   Allergies  Allergen Reactions   Amoxicillin    Ibuprofen  Itching   Naproxen    Penicillins Rash     Review of Systems    Objective:   BP (!) 134/90 (BP Location: Left Arm, Patient Position: Sitting, Cuff Size: Normal)   Pulse 63   Resp 16   Ht 4\' 11"  (1.499 m)   Wt 188 lb (85.3 kg)   LMP 01/25/2024   BMI 37.97 kg/m   Physical Exam NAD HEENT:  PERRL, EOMI, conjunctivae without injection.  TMs pearly gray.  Nasal mucosa swollen.  No discharge.  Mild tenderness over maxillary sinus areas bilaterally.  Throat without injection. Neck:  Supple, No adenopathy Chest:  CTA CV:  RRR without murmur or rub.  Radial and DP pulses normal and equal  COVID and influenza testing negative. Assessment & Plan    Subacute sinusitis:  Zpack, nasal saline.  2.  Allergy or reaction to cleaning powders: encouraged mask when using at work.  Try Loratadine  10 mg daily on days working.

## 2024-02-19 ENCOUNTER — Other Ambulatory Visit: Payer: Self-pay

## 2024-02-22 ENCOUNTER — Ambulatory Visit: Payer: Self-pay | Admitting: Internal Medicine

## 2024-03-07 ENCOUNTER — Encounter (HOSPITAL_COMMUNITY): Payer: Self-pay

## 2024-03-07 ENCOUNTER — Ambulatory Visit (HOSPITAL_COMMUNITY)
Admission: EM | Admit: 2024-03-07 | Discharge: 2024-03-07 | Disposition: A | Discharge status: Home / Self Care | Payer: Self-pay | Attending: Family Medicine | Admitting: Family Medicine

## 2024-03-07 DIAGNOSIS — J0100 Acute maxillary sinusitis, unspecified: Secondary | ICD-10-CM | POA: Insufficient documentation

## 2024-03-07 DIAGNOSIS — J029 Acute pharyngitis, unspecified: Secondary | ICD-10-CM | POA: Insufficient documentation

## 2024-03-07 DIAGNOSIS — K219 Gastro-esophageal reflux disease without esophagitis: Secondary | ICD-10-CM | POA: Insufficient documentation

## 2024-03-07 LAB — POCT RAPID STREP A (OFFICE): Rapid Strep A Screen: NEGATIVE

## 2024-03-07 MED ORDER — DOXYCYCLINE HYCLATE 100 MG PO CAPS: 100.0000 mg | ORAL_CAPSULE | Freq: Two times a day (BID) | ORAL | 0 refills | Status: DC

## 2024-03-07 MED ORDER — OMEPRAZOLE 20 MG PO CPDR: 20.0000 mg | DELAYED_RELEASE_CAPSULE | Freq: Every day | ORAL | 0 refills | Status: DC

## 2024-03-07 NOTE — ED Triage Notes (Signed)
 Pt presents to UC for c/o sore throat x2-3 weeks. Pt reports having the flu 1 month ago and has had a hoarse voice since then. Pt was seen by a doctor and prescribed azithromycin 2 weeks ago.  Pt states she has had reflux for the past 2 weeks and is unsure if throat pain is d/t reflux or cold symptoms. Tums and tylenol provide some relief.  Interpreter Leretha Pol [phone]

## 2024-03-07 NOTE — Discharge Instructions (Signed)
 Hoy le diagnosticaron una infeccin sinusal. Le recet un antibitico para tratarla. Tambin debera usar Flonase de venta libre para sus sntomas. Su prueba de estreptococos dio negativo hoy.  Tambin le recet medicamentos para sus sntomas de reflujo. Tmelos diariamente [employer]. Consulte con su mdico de cabecera para este problema crnico.  You were diagnosed with a sinus infection today.  I have sent out an antibiotic to treat this today.  You should also use over the counter flonase for your symptoms. Your strep test was negative today.   I have also sent out medications for your reflux symptoms.  Please take this daily x 2 weeks.  Follow up with your primary care provider for this chronic issue.
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
## 2024-03-10 LAB — CULTURE, GROUP A STREP (THRC)

## 2024-04-05 DIAGNOSIS — Z889 Allergy status to unspecified drugs, medicaments and biological substances status: Secondary | ICD-10-CM | POA: Insufficient documentation

## 2024-04-07 ENCOUNTER — Ambulatory Visit: Payer: Self-pay | Admitting: Internal Medicine

## 2024-04-07 ENCOUNTER — Encounter: Payer: Self-pay | Admitting: Internal Medicine

## 2024-04-07 VITALS — BP 120/86 | HR 110 | Resp 16 | Ht 59.0 in | Wt 188.0 lb

## 2024-04-07 DIAGNOSIS — J029 Acute pharyngitis, unspecified: Secondary | ICD-10-CM

## 2024-04-07 DIAGNOSIS — K0889 Other specified disorders of teeth and supporting structures: Secondary | ICD-10-CM

## 2024-04-07 DIAGNOSIS — H9202 Otalgia, left ear: Secondary | ICD-10-CM

## 2024-04-07 LAB — POCT INFLUENZA A/B
Influenza A, POC: NEGATIVE
Influenza B, POC: NEGATIVE

## 2024-04-07 LAB — POCT RAPID STREP A (OFFICE): Rapid Strep A Screen: NEGATIVE

## 2024-04-07 LAB — POC COVID19 BINAXNOW: SARS Coronavirus 2 Ag: NEGATIVE

## 2024-04-07 NOTE — Progress Notes (Signed)
    Subjective:    Patient ID: Janice Mccormick, female   DOB: 1991-12-01, 33 y.o.   MRN: [id number]   HPI  Claretha Crocker interprets  See previous visit from March 6th here when treated with Zpack for sinusitis and then seen in ED for same as did not completely resolve sinusitis symptoms and treated with Doxycycline .  Symptoms then cleared.  Now with pain from left upper and lower molars.  Has also developed left ear pain and left throat as well as discomfort when presses her left anterior cervical lymph node area. No fever. No drainage or swelling from gingiva.   No congestion or cough.        Current Meds  Medication Sig   acetaminophen  (TYLENOL ) 500 MG tablet Take 1 tablet (500 mg total) by mouth every 6 (six) hours as needed.   loratadine  (CLARITIN ) 10 MG tablet Take 1 tablet (10 mg total) by mouth daily.   omeprazole  (PRILOSEC) 20 MG capsule Take 1 capsule (20 mg total) by mouth daily.   Allergies  Allergen Reactions   Amoxicillin    Ibuprofen  Itching   Naproxen    Penicillins Rash     Review of Systems    Objective:   BP 120/86 (BP Location: Right Arm, Patient Position: Sitting, Cuff Size: Normal)   Pulse (!) 110   Resp 16   Ht 4\' 11"  (1.499 m)   Wt 188 lb (85.3 kg)   LMP 03/29/2024 (Exact Date)   BMI 37.97 kg/m   Physical Exam Constitutional:      Appearance: She is obese.  HENT:     Head: Normocephalic and atraumatic.     Right Ear: Tympanic membrane, ear canal and external ear normal.     Left Ear: Tympanic membrane, ear canal and external ear normal.     Nose: Nose normal.     Mouth/Throat:     Mouth: Mucous membranes are moist.     Pharynx: No oropharyngeal exudate or posterior oropharyngeal erythema.     Comments: Significant enamel wear and flattening of chewing surfaces.  No erythema or swelling of gingiva about teeth she states causing pain in upper and lower left jaw. Eyes:     Extraocular Movements: Extraocular movements intact.      Conjunctiva/sclera: Conjunctivae normal.     Pupils: Pupils are equal, round, and reactive to light.  Cardiovascular:     Rate and Rhythm: Normal rate and regular rhythm.     Pulses: Normal pulses.     Heart sounds: Normal heart sounds. No murmur heard.    No friction rub.  Pulmonary:     Effort: Pulmonary effort is normal.     Breath sounds: Normal breath sounds.  Abdominal:     General: Bowel sounds are normal.     Palpations: Abdomen is soft.  Musculoskeletal:     Cervical back: Normal range of motion and neck supple. Tenderness (left anterior cervical area, but without adenopathy) present.  Neurological:     Mental Status: She is alert.      Assessment & Plan   Left throat pain:  likely radiating into left ear as ear appears normal.   Check COVID, influenza and strep A Tylenol  as needed for now Addendum:  strep, flu, COVID all negative  2.  Dental pain:  has dental appt in July.  Do not currently see signs of dental infection.

## 2024-04-07 NOTE — Patient Instructions (Signed)
 We will call with lab results  Tylenol  650 mg cada 4 horas a necesita dolor de garganta

## 2024-05-04 ENCOUNTER — Ambulatory Visit (INDEPENDENT_AMBULATORY_CARE_PROVIDER_SITE_OTHER): Payer: Self-pay | Admitting: Internal Medicine

## 2024-05-04 ENCOUNTER — Encounter (INDEPENDENT_AMBULATORY_CARE_PROVIDER_SITE_OTHER): Payer: Self-pay | Admitting: Internal Medicine

## 2024-05-04 VITALS — BP 117/75 | HR 89 | Temp 98.5°F | Ht 60.0 in | Wt 183.0 lb

## 2024-05-04 DIAGNOSIS — Z6835 Body mass index (BMI) 35.0-35.9, adult: Secondary | ICD-10-CM

## 2024-05-04 DIAGNOSIS — E669 Obesity, unspecified: Secondary | ICD-10-CM

## 2024-05-04 DIAGNOSIS — Z5971 Insufficient health insurance coverage: Secondary | ICD-10-CM | POA: Insufficient documentation

## 2024-05-04 DIAGNOSIS — E7800 Pure hypercholesterolemia, unspecified: Secondary | ICD-10-CM

## 2024-05-04 DIAGNOSIS — K219 Gastro-esophageal reflux disease without esophagitis: Secondary | ICD-10-CM

## 2024-05-04 DIAGNOSIS — R7303 Prediabetes: Secondary | ICD-10-CM

## 2024-05-04 DIAGNOSIS — K760 Fatty (change of) liver, not elsewhere classified: Secondary | ICD-10-CM | POA: Insufficient documentation

## 2024-05-04 NOTE — Assessment & Plan Note (Addendum)
 Found incidentally on CT scan from 2020 described as mild hepatic steatosis.  Losing 10 to 15% of body weight may improve condition.  Also reducing saturated fats, simple and added sugars in diet.  Most recent liver enzymes and platelet count are normal.  Consider checking PT and INR and other causes of hepatic steatosis at intake appointment.

## 2024-05-04 NOTE — Assessment & Plan Note (Signed)
 On omeprazole  as per primary care team currently asymptomatic.  Losing weight may improve acid reflux.

## 2024-05-04 NOTE — Assessment & Plan Note (Signed)
 Reviewed most recent labs her A1c was 6.4, she was not aware of how close assist to having diabetes.  No history of gestational diabetes but there is family history.  She would benefit for pharmacoprophylaxis in addition to nutritional behavioral strategies for weight management.  If she decides to enroll in our program we will check fasting blood sugar insulin and hemoglobin A1c with intake labs.  She first needs to sort out insurance coverage as the frequency of her appointments may be cost prohibitive.

## 2024-05-04 NOTE — Assessment & Plan Note (Signed)
 Mild elevations in LDL cholesterol.  This may improve with 10% weight loss as well as reducing saturated fats in diet.  We will check fasting lipid profile with intake labs.
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
## 2024-05-04 NOTE — Assessment & Plan Note (Signed)
 In addition to a language barrier she is underinsured and I think she has some coverage through the county.  I have referred her to our front desk so they can help her address her questions regarding coverage.  Considering that this is intensive lifestyle program the frequency of appointments may be cost prohibitive we may have to make some modifications to our approach as well as obtain Spanish content for patient which at present time is very limited.

## 2024-05-15 ENCOUNTER — Ambulatory Visit (HOSPITAL_COMMUNITY)
Admission: EM | Admit: 2024-05-15 | Discharge: 2024-05-15 | Disposition: A | Discharge status: Home / Self Care | Payer: Self-pay | Attending: Physician Assistant | Admitting: Physician Assistant

## 2024-05-15 ENCOUNTER — Encounter (HOSPITAL_COMMUNITY): Payer: Self-pay | Admitting: Emergency Medicine

## 2024-05-15 ENCOUNTER — Other Ambulatory Visit: Payer: Self-pay

## 2024-05-15 DIAGNOSIS — J029 Acute pharyngitis, unspecified: Secondary | ICD-10-CM

## 2024-05-15 DIAGNOSIS — M542 Cervicalgia: Secondary | ICD-10-CM

## 2024-05-15 DIAGNOSIS — J0100 Acute maxillary sinusitis, unspecified: Secondary | ICD-10-CM

## 2024-05-15 MED ORDER — AZITHROMYCIN 250 MG PO TABS
ORAL_TABLET | ORAL | 0 refills | Status: DC
Start: 2024-05-15 — End: 2024-05-26

## 2024-05-15 MED ORDER — CYCLOBENZAPRINE HCL 5 MG PO TABS
5.0000 mg | ORAL_TABLET | Freq: Every evening | ORAL | 0 refills | Status: DC | PRN
Start: 2024-05-15 — End: 2024-05-26

## 2024-05-15 NOTE — Discharge Instructions (Addendum)
 Please take the Z-pak as prescribed to help with your throat and sinus infection. You may use Tylenol  as needed. Flexeril  at bedtime as needed to help with muscle relaxation / pain in the neck. Heating pad may also help. Follow with your PCP if worse or no improvement.

## 2024-05-15 NOTE — ED Provider Notes (Signed)
 [facility]   MRN: [id number] DOB: 03-25-1991  Subjective:   Janice Mccormick is a 33 y.o. female presenting for 2 weeks of sore throat and left sinus pain and pressure.  Complains of her left upper molar.  She follows with a dentist this coming July.  Patient also complains of pain along the back of her neck and upper shoulders after exercising.  She is requesting antibiotics to treat her symptoms.  Denies any fever or chills.  No recent travel.  No headaches or dizziness.  No rash.  No other symptoms at this time.  She states that she cannot take NSAIDs.  She states that she cannot take penicillins due to rash.  She is very adamant against taking prednisone  as her daughter recently had a reaction.  No current facility-administered medications for this encounter.  Current Outpatient Medications:    azithromycin  (ZITHROMAX  Z-PAK) 250 MG tablet, Take two tablets on day one, followed by one tablet daily for the next four days., Disp: 6 tablet, Rfl: 0   cyclobenzaprine  (FLEXERIL ) 5 MG tablet, Take 1 tablet (5 mg total) by mouth at bedtime as needed for muscle spasms., Disp: 10 tablet, Rfl: 0   acetaminophen  (TYLENOL ) 500 MG tablet, Take 1 tablet (500 mg total) by mouth every 6 (six) hours as needed. (Patient not taking: Reported on 05/04/2024), Disp: 30 tablet, Rfl: 0   loratadine  (CLARITIN ) 10 MG tablet, Take 1 tablet (10 mg total) by mouth daily. (Patient not taking: Reported on 05/04/2024), Disp: , Rfl:    omeprazole  (PRILOSEC) 20 MG capsule, Take 1 capsule (20 mg total) by mouth daily., Disp: 14 capsule, Rfl: 0   Allergies  Allergen Reactions   Amoxicillin    Ibuprofen  Itching   Naproxen    Penicillins Rash    Past Medical History:  Diagnosis Date   Environmental allergies 2018   GERD (gastroesophageal reflux disease) 2018   Clinic in [facility] [place].  abdominal ultrasound normal   Iron deficiency anemia    due to chronic menstrual blood loss   LGSIL on Pap smear  of cervix 08/2023   Obesity (BMI 35.0-39.9 without comorbidity)    Prediabetes      Past Surgical History:  Procedure Laterality Date   CESAREAN SECTION  2012, 2014   CESAREAN SECTION     TUBAL LIGATION  2014   with last c section    Family History  Problem Relation Age of Onset   Diabetes Mother    Hypertension Mother    Diabetes Sister     Social History   Tobacco Use   Smoking status: Never    Passive exposure: Never   Smokeless tobacco: Never  Vaping Use   Vaping status: Never Used  Substance Use Topics   Alcohol use: Not Currently   Drug use: Never    ROS REFER TO HPI FOR PERTINENT POSITIVES AND NEGATIVES   Objective:   Vitals: BP 130/82 (BP Location: Right Arm)   Pulse (!) 109   Temp 98 F (36.7 C) (Oral)   Resp 18   LMP 04/28/2024   SpO2 99%   Physical Exam Vitals and nursing note reviewed.  Constitutional:      General: She is not in acute distress.    Appearance: Normal appearance. She is not ill-appearing.  HENT:     Head: Normocephalic.     Right Ear: Tympanic membrane, ear canal and external ear normal.     Left Ear: Tympanic membrane, ear  canal and external ear normal.     Nose: No congestion.     Left Sinus: Maxillary sinus tenderness present.     Mouth/Throat:     Mouth: Mucous membranes are moist.     Dentition: Normal dentition. No dental caries, dental abscesses or gum lesions. Dental tenderness: left upper molars.    Tongue: No lesions.     Pharynx: No oropharyngeal exudate or posterior oropharyngeal erythema.     Tonsils: No tonsillar exudate or tonsillar abscesses. 2+ on the right. 2+ on the left.  Eyes:     Extraocular Movements: Extraocular movements intact.     Conjunctiva/sclera: Conjunctivae normal.     Pupils: Pupils are equal, round, and reactive to light.  Cardiovascular:     Rate and Rhythm: Normal rate and regular rhythm.     Pulses: Normal pulses.     Heart sounds: Normal heart sounds. No murmur heard. Pulmonary:      Effort: Pulmonary effort is normal. No respiratory distress.     Breath sounds: Normal breath sounds. No wheezing.  Musculoskeletal:        General: No swelling, tenderness, deformity or signs of injury.     Cervical back: Normal range of motion.     Comments: Full ROM of neck   Skin:    General: Skin is warm.  Neurological:     Mental Status: She is alert and oriented to person, place, and time.  Psychiatric:        Mood and Affect: Mood normal.        Behavior: Behavior normal.     No results found for this or any previous visit (from the past 24 hours).  Assessment and Plan :   PDMP not reviewed this encounter.  1. Acute pharyngitis, unspecified etiology   2. Neck pain   3. Acute non-recurrent maxillary sinusitis    With complaints of symptoms lasting greater than 10 days, at this point empiric antibiotic could be helpful.  She cannot take amoxicillin, but was not very clear on her reaction to this.  Therefore going to avoid cephalosporin group for any potential cross-reactivity at this time.  Provided patient with Z-Pak.  She can use Tylenol  as needed.  She can use nasal saline and humidifier.  Flexeril  provided to take at bedtime to help with muscle relaxation.  There were no red flags on exam.  She can follow-up with her PCP in the coming few weeks.  ER if any red flags, patient agreeable and understanding of plan. Pt aware of risks vs benefits and possible adverse reactions.    Kevyn Wengert, Deleta Felix, PA-C 05/15/24 1640

## 2024-05-15 NOTE — ED Triage Notes (Addendum)
 Symptoms started 2 weeks ago.  Complains of sore throat, on left side.  Patient is concerned for an abscess of tonsil.  Patient has other oral pain, but does have a dentist appt and has talked to pcp about issues.  Repeatedly says this is not a sore throat.  Pain in left tonsil  Patient also has a pain in right side of head that was noticed after exercising and patient reports in varies in severity

## 2024-05-15 NOTE — ED Notes (Signed)
 Has a teenage child with her

## 2024-05-20 ENCOUNTER — Other Ambulatory Visit: Payer: Self-pay

## 2024-05-20 DIAGNOSIS — R7303 Prediabetes: Secondary | ICD-10-CM

## 2024-05-21 LAB — IRON AND TIBC
Iron Saturation: 19 % (ref 15–55)
Iron: 75 ug/dL (ref 27–159)
Total Iron Binding Capacity: 400 ug/dL (ref 250–450)
UIBC: 325 ug/dL (ref 131–425)

## 2024-05-21 LAB — CBC WITH DIFFERENTIAL/PLATELET

## 2024-05-21 LAB — HEMOGLOBIN A1C
Est. average glucose Bld gHb Est-mCnc: 126 mg/dL
Hgb A1c MFr Bld: 6 % — ABNORMAL HIGH (ref 4.8–5.6)

## 2024-05-21 LAB — SPECIMEN STATUS REPORT

## 2024-05-26 ENCOUNTER — Encounter: Payer: Self-pay | Admitting: Internal Medicine

## 2024-05-26 ENCOUNTER — Ambulatory Visit: Payer: Self-pay | Admitting: Internal Medicine

## 2024-05-26 ENCOUNTER — Telehealth: Payer: Self-pay | Admitting: Internal Medicine

## 2024-05-26 VITALS — BP 122/84 | HR 115 | Resp 16 | Ht 60.0 in | Wt 185.5 lb

## 2024-05-26 DIAGNOSIS — R7303 Prediabetes: Secondary | ICD-10-CM

## 2024-05-26 DIAGNOSIS — D75839 Thrombocytosis, unspecified: Secondary | ICD-10-CM | POA: Insufficient documentation

## 2024-05-26 DIAGNOSIS — E611 Iron deficiency: Secondary | ICD-10-CM | POA: Insufficient documentation

## 2024-05-26 DIAGNOSIS — J36 Peritonsillar abscess: Secondary | ICD-10-CM

## 2024-05-26 NOTE — Progress Notes (Signed)
 Subjective:    Patient ID: Janice Mccormick, female   DOB: [birth year], 33 y.o.   MRN: [id number]   HPI   Thrombocytosis:  was slightly elevated in September.  Normalized in January and was to be rechecked with recent labs, but for some reason CBC was cancelled and iron studies run, which were normal.  Pt. Apparently presented on Friday for labs and stated she had started a diet and was dizzy and wanted her iron studies performed again.  Had also started a lot of exercise.  Sounds like she strained her neck muscles with these new exercise.  She is no longer having the discomfort or dizziness after recuperation for overdoing it.   2.  Prediabetes:  A1C dropped from 6.4% in January to 6.0% on the 6th.    3.  By the way:  feels she has an abscess on her left tonsil.  Felt there was something there possibly dating back to her diagnosis of influenza A in January.  She has been seen here and at urgent care 5 separate times for sinus and throat complaints.   She thinks about 2 weeks after seen here for tooth pain and left ear pain on April 24th, she noted the abscess and was able to almost fully drain the white fluid collection.  Feels the swelling started recurring about 3 weeks ago.   She did not make it to the dentist after referral in April. She was recently treated by urgent care with another round of Zpack and feels perhaps the swelling is a bit smaller. No pain with swallowing--tender when presses on her left anterior neck against the swelling on her tonsil.   No fever.  No change in voice. She apparently has has significant issues with tonsillar infections since she was quite young and may have had abscesses before.  She was told several years ago in [place]  she should have her tonsil removed, but decided agaisnt  Current Meds  Medication Sig   acetaminophen  (TYLENOL ) 500 MG tablet Take 1 tablet (500 mg total) by mouth every 6 (six) hours as needed.   omeprazole  (PRILOSEC) 20 MG  capsule Take 1 capsule (20 mg total) by mouth daily.   Allergies  Allergen Reactions   Amoxicillin    Ibuprofen  Itching   Naproxen    Penicillins Rash     Review of Systems    Objective:   BP 122/84 (BP Location: Left Arm, Patient Position: Sitting, Cuff Size: Normal)   Pulse (!) 115   Resp 16   Ht 5' (1.524 m)   Wt 185 lb 8 oz (84.1 kg)   LMP 04/28/2024   SpO2 97%   BMI 36.23 kg/m   Physical Exam NAD HEENT:  PERRL, EOMI, TMs pearly gray, throat without injection, but tonsils large and at inferior pole of left tonsil, she has what appears to be a 1 cm white fluid filled cyst/abscess.  Mildly tender.  Only able to adequately view with depression of tongue base.  Wall of lesion does not rupture with pressure from tongue depressor, though patient tolerates well.  No surrounding erythema. Neck:  Supple, Shotty left anterior cervical nodes Chest:  CTA CV:  RRR without murumur or rub.   Assessment & Plan   Thrombocytosis:  resolved in January, but rechecking to be certain remains normal.  CBC was cancelled by [facility] and trying to find out what happened.  Iron studies normal  2.  Prediabetes:  improved sugar control.  To continue working  on lifestyle.    3.  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  Call into [facility] to see if we can get her in quickly for evaluation and treatment.  May need tonsils removed as well with her lifelong issues.  She has follow up in September, but to call if she does not hear back from us  tomorrow late morning.

## 2024-05-26 NOTE — Telephone Encounter (Signed)
 Called patient to inform her about an appointment that Doctor Jayne Mews has schedule for her with a Specialist.  Patient is aware appointment is scheduled for  Thursday June 19, at 9:45 AM with Doctor Lydia Sams.  Patient has been asked to arrive to appointment  at 9:30 AM .  Provided patient with address [address].  Patient agree to attend to appointment and was also informed that Dr. Basilio Both office accepts financial assistance.

## 2024-05-26 NOTE — Patient Instructions (Signed)
 She has been instructed to call if she has problems with opening her mouth, fever, increased swelling in throat, fever, difficulty swallowing or increased throat pain.

## 2024-06-02 ENCOUNTER — Telehealth (INDEPENDENT_AMBULATORY_CARE_PROVIDER_SITE_OTHER): Payer: Self-pay | Admitting: Otolaryngology

## 2024-06-02 ENCOUNTER — Encounter (INDEPENDENT_AMBULATORY_CARE_PROVIDER_SITE_OTHER): Payer: Self-pay | Admitting: Otolaryngology

## 2024-06-02 ENCOUNTER — Ambulatory Visit (INDEPENDENT_AMBULATORY_CARE_PROVIDER_SITE_OTHER): Payer: Self-pay | Admitting: Otolaryngology

## 2024-06-02 VITALS — BP 139/82 | HR 111 | Wt 183.0 lb

## 2024-06-02 DIAGNOSIS — J029 Acute pharyngitis, unspecified: Secondary | ICD-10-CM

## 2024-06-02 DIAGNOSIS — K0889 Other specified disorders of teeth and supporting structures: Secondary | ICD-10-CM

## 2024-06-02 DIAGNOSIS — J0391 Acute recurrent tonsillitis, unspecified: Secondary | ICD-10-CM

## 2024-06-02 MED ORDER — CLINDAMYCIN HCL 300 MG PO CAPS: 300.0000 mg | ORAL_CAPSULE | Freq: Three times a day (TID) | ORAL | 0 refills | Status: AC

## 2024-06-02 NOTE — Progress Notes (Signed)
 Dear Dr. Jayne Mews, Here is my assessment for our mutual patient, Janice Mccormick. Thank you for allowing me the opportunity to care for your patient. Please do not hesitate to contact me should you have any other questions. Sincerely, Dr. Milon Aloe  Otolaryngology Clinic Note Referring provider: Dr. Jayne Mews HPI:  Janice Mccormick is a 33 y.o. female kindly referred by Dr. Jayne Mews for evaluation of sore throat and recurrent tonsillitis episodes  Initial visit (05/2024): She reports that she has had some sore throat over the past few weeks (left more than right), in addition to some URI sx. No fevers, but has felt perhaps a stone or an abscess on her left which she reports she popped and drain(?) - did not appear to be purulent however. Tonsils intermittently feel swollen. During this, left side of neck also feels fuller. She does endorse getting about 3-4 episodes of tonsillitis per year, for which he gets antibiotics. No prior PTAs. A physician in [place]  several years ago recommended tonsillectomy but she did not wish for it. In addition, she reports some odontalgia on the left mandibular and maxillary molars which also contribute to some of her pain. She is finally seeing a dentist and is scheduled for some dental work in July.  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  Antibiotics do help with her pain and, currently, she does report mild tonsillar swelling. Patient otherwise denies: - dysphagia, odynophagia, aspiration episodes or PNA, need for Heimlich, unintentional weight loss - changes in voice, shortness of breath, hemoptysis - neck masses, B symptoms  In person interpreter used  H&N Surgery: denies otherwise Personal or FHx of bleeding dz or anesthesia difficulty: no  GLP-1: no AP/AC: no  Tobacco: denies  PMHx: PreDM, Thrombocytosis, Environmental allergies  Independent Review of Additional Tests or Records:  Dr. Jayne Mews  notes (05/26/2024): noted dental pain, sore throat, possible abscess? Dx: consillar abscess; Rx: ref to ENT 05/15/2024 UC visit: noted acute pharyngitis with dental pain as well; Dx: Pharyngitis Rx: Z-pak Dr. Jayne Mews (04/07/2024): sinusitis f/u, noted odontalgia; left ear and throat discomfort; supportive care Labs: CBC 01/11/2024: WBC 10.8 GAS results (multiple, see below):  PMH/Meds/All/SocHx/FamHx/ROS:   Past Medical History:  Diagnosis Date   Environmental allergies 2018   GERD (gastroesophageal reflux disease) 2018   Clinic in [facility] [place].  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   Obesity (BMI 35.0-39.9 without comorbidity)    Prediabetes      Past Surgical History:  Procedure Laterality Date   CESAREAN SECTION  2012, 2014   CESAREAN SECTION     TUBAL LIGATION  2014   with last c section    Family History  Problem Relation Age of Onset   Diabetes Mother    Hypertension Mother    Diabetes Sister      Social Connections: Somewhat Isolated (12/17/2018)   Social Connection and Isolation Panel    Frequency of Communication with Friends and Family: More than three times a week    Frequency of Social Gatherings with Friends and Family: More than three times a week    Attends Religious Services: Never    Database administrator or Organizations: No    Attends Banker Meetings: Never    Marital Status: Married      Current Outpatient Medications:    acetaminophen  (TYLENOL ) 500 MG  tablet, Take 1 tablet (500 mg total) by mouth every 6 (six) hours as needed., Disp: 30 tablet, Rfl: 0   clindamycin (CLEOCIN) 300 MG capsule, Take 1 capsule (300 mg total) by mouth 3 (three) times daily for 10 days., Disp: 30 capsule, Rfl: 0   loratadine  (CLARITIN ) 10 MG tablet, Take 1 tablet (10 mg total) by mouth daily., Disp: , Rfl:    omeprazole  (PRILOSEC) 20 MG capsule, Take 1 capsule (20 mg  total) by mouth daily., Disp: 14 capsule, Rfl: 0   Soft Lens Products (SENSITIVE EYES SALINE) SOLN, by Does not apply route., Disp: , Rfl:    Physical Exam:   BP 139/82 (BP Location: Left Arm, Patient Position: Sitting, Cuff Size: Large)   Pulse (!) 111   Wt 183 lb (83 kg)   LMP 04/28/2024   SpO2 96%   BMI 35.74 kg/m   Salient findings:  CN II-XII intact Bilateral EAC clear and TM intact with well pneumatized middle ear spaces Anterior rhinoscopy: Septum intact; bilateral inferior turbinates without significant hypertrophy No lesions of oral cavity/oropharynx; dentition poor --- tonsils 2/2, small stones; left mid-pole what appears to be small mucous retention cyst; tonsils not firm; no uvular deviation or trismus; no evidence of PTA No obviously palpable neck masses/lymphadenopathy/thyromegaly No respiratory distress or stridor; TFL was indicated to better evaluate the proximal airway, given the patient's history and exam findings, and is detailed below.  Seprately Identifiable Procedures:  Prior to initiating any procedures, risks/benefits/alternatives were explained to the patient and verbal consent obtained. Procedure Note Pre-procedure diagnosis:  Sore throat, pharyngitis, rule out abscess Post-procedure diagnosis: Same Procedure: Transnasal Fiberoptic Laryngoscopy, CPT 31575 - Mod 25 Indication: see above Complications: None apparent EBL: 0 mL  The procedure was undertaken to further evaluate the patient's complaint above, with mirror exam inadequate for appropriate examination due to gag reflex and poor patient tolerance  Procedure:  Patient was identified as correct patient. Verbal consent was obtained. The nose was sprayed with oxymetazoline and 4% lidocaine . The The flexible laryngoscope was passed through the nose to view the nasal cavity, pharynx (oropharynx, hypopharynx) and larynx.  The larynx was examined at rest and during multiple phonatory tasks. Documentation was  obtained and reviewed with patient. The scope was removed. The patient tolerated the procedure well.  Findings: The nasal cavity and nasopharynx did not reveal any masses or lesions, mucosa appeared to be without obvious lesions. The tongue base, pharyngeal walls, piriform sinuses, vallecula, epiglottis and postcricoid region are normal in appearance, with mild edema lingual tonsils. The visualized portion of the subglottis and proximal trachea is widely patent. The vocal folds are mobile bilaterally. There are no lesions on the free edge of the vocal folds nor elsewhere in the larynx worrisome for malignancy.    Electronically signed by: Evelina Hippo, MD 06/02/2024 2:01 PM   Impression & Plans:  Janice Mccormick is a 33 y.o. female with:  1. Sore throat   2. Odontalgia   3. Recurrent tonsillitis    No evidence of tonsillar abscess or abscess otherwise; does have frequent episodes of sore throat requiring abx (3/year for at least 3 years - though multiple streps negative). Also having dental work which can also cause referred pain. TFL overall reassuring. We discussed options: observation, longer course of abx, tonsillectomy. Given upcoming dental work, will hold off on tonsillectomy. She decided against longer course of abx, but will empirically Rx with Clinda for sore throat (though low risk it is bacterial). Recommend  salt water gargles daily and good oral care F/u in 6 months - if still issues and done with dental work, will offer tonsillectomy given frequent exacerbations  See below regarding exact medications prescribed this encounter including dosages and route: Meds ordered this encounter  Medications   clindamycin (CLEOCIN) 300 MG capsule    Sig: Take 1 capsule (300 mg total) by mouth 3 (three) times daily for 10 days.    Dispense:  30 capsule    Refill:  0      Thank you for allowing me the opportunity to care for your patient. Please do not hesitate to contact me should  you have any other questions.  Sincerely, Milon Aloe, MD Otolaryngologist (ENT), [facility] Phone: [phone] Fax: [phone]  06/02/2024, 2:01 PM   MDM:  Level 4 - 99204 Complexity/Problems addressed: mod - chronic problems Data complexity: mod - independent review of notes, labs - Morbidity: mod   - Drug prescribed or managed: y

## 2024-06-02 NOTE — Telephone Encounter (Signed)
 Patient stated at check out that she is allergic to Amoxicillin.  She would like that added to her patient record.

## 2024-06-02 NOTE — Patient Instructions (Signed)
 Take clindamycin 300mg  three times per day for 10 days.

## 2024-10-14 ENCOUNTER — Other Ambulatory Visit: Payer: Self-pay

## 2024-10-14 DIAGNOSIS — Z0000 Encounter for general adult medical examination without abnormal findings: Secondary | ICD-10-CM

## 2024-10-15 ENCOUNTER — Ambulatory Visit: Payer: Self-pay | Admitting: Internal Medicine

## 2024-10-15 LAB — CBC WITH DIFFERENTIAL/PLATELET
Basophils Absolute: 0 x10E3/uL (ref 0.0–0.2)
Basos: 0 %
EOS (ABSOLUTE): 0.1 x10E3/uL (ref 0.0–0.4)
Eos: 1 %
Hematocrit: 38.9 % (ref 34.0–46.6)
Hemoglobin: 12.6 g/dL (ref 11.1–15.9)
Immature Grans (Abs): 0 x10E3/uL (ref 0.0–0.1)
Immature Granulocytes: 0 %
Lymphocytes Absolute: 2.8 x10E3/uL (ref 0.7–3.1)
Lymphs: 34 %
MCH: 30.4 pg (ref 26.6–33.0)
MCHC: 32.4 g/dL (ref 31.5–35.7)
MCV: 94 fL (ref 79–97)
Monocytes Absolute: 0.4 x10E3/uL (ref 0.1–0.9)
Monocytes: 4 %
Neutrophils Absolute: 5.1 x10E3/uL (ref 1.4–7.0)
Neutrophils: 61 %
Platelets: 427 x10E3/uL (ref 150–450)
RBC: 4.15 x10E6/uL (ref 3.77–5.28)
RDW: 12 % (ref 11.7–15.4)
WBC: 8.4 x10E3/uL (ref 3.4–10.8)

## 2024-10-15 LAB — COMPREHENSIVE METABOLIC PANEL WITH GFR
ALT: 25 IU/L (ref 0–32)
AST: 24 IU/L (ref 0–40)
Albumin: 4.5 g/dL (ref 3.9–4.9)
Alkaline Phosphatase: 96 IU/L (ref 41–116)
BUN/Creatinine Ratio: 15 (ref 9–23)
BUN: 10 mg/dL (ref 6–20)
Bilirubin Total: 0.5 mg/dL (ref 0.0–1.2)
CO2: 21 mmol/L (ref 20–29)
Calcium: 9.7 mg/dL (ref 8.7–10.2)
Chloride: 102 mmol/L (ref 96–106)
Creatinine, Ser: 0.66 mg/dL (ref 0.57–1.00)
Globulin, Total: 3.2 g/dL (ref 1.5–4.5)
Glucose: 87 mg/dL (ref 70–99)
Potassium: 4 mmol/L (ref 3.5–5.2)
Sodium: 138 mmol/L (ref 134–144)
Total Protein: 7.7 g/dL (ref 6.0–8.5)
eGFR: 117 mL/min/1.73 (ref >59)

## 2024-10-15 LAB — LIPID PANEL
Chol/HDL Ratio: 3.3 ratio (ref 0.0–4.4)
Cholesterol, Total: 167 mg/dL (ref 100–199)
HDL: 50 mg/dL (ref >39)
LDL Chol Calc (NIH): 103 mg/dL — ABNORMAL HIGH (ref 0–99)
Triglycerides: 72 mg/dL (ref 0–149)
VLDL Cholesterol Cal: 14 mg/dL (ref 5–40)

## 2024-10-15 LAB — HEMOGLOBIN A1C
Est. average glucose Bld gHb Est-mCnc: 131 mg/dL
Hgb A1c MFr Bld: 6.2 % — ABNORMAL HIGH (ref 4.8–5.6)

## 2024-10-20 ENCOUNTER — Other Ambulatory Visit: Payer: Self-pay

## 2024-10-24 ENCOUNTER — Ambulatory Visit: Payer: Self-pay | Admitting: Internal Medicine

## 2024-10-24 ENCOUNTER — Encounter: Payer: Self-pay | Admitting: Internal Medicine

## 2024-10-24 ENCOUNTER — Other Ambulatory Visit: Payer: Self-pay | Admitting: Internal Medicine

## 2024-10-24 VITALS — BP 132/91 | HR 96 | Resp 19 | Ht 60.0 in | Wt 193.5 lb

## 2024-10-24 DIAGNOSIS — H11003 Unspecified pterygium of eye, bilateral: Secondary | ICD-10-CM

## 2024-10-24 DIAGNOSIS — R877 Abnormal histological findings in specimens from female genital organs: Secondary | ICD-10-CM

## 2024-10-24 DIAGNOSIS — H5310 Unspecified subjective visual disturbances: Secondary | ICD-10-CM

## 2024-10-24 DIAGNOSIS — Z0000 Encounter for general adult medical examination without abnormal findings: Secondary | ICD-10-CM

## 2024-10-24 DIAGNOSIS — K029 Dental caries, unspecified: Secondary | ICD-10-CM

## 2024-10-24 MED ORDER — FAMOTIDINE 40 MG PO TABS: 40.0000 mg | ORAL_TABLET | Freq: Every day | ORAL | 6 refills | Status: DC

## 2024-10-24 NOTE — Progress Notes (Signed)
 Subjective:    Patient ID: Janice Mccormick, female   DOB: 1991/12/10, 33 y.o.   MRN: [id number]   HPI  Janice Mccormick interprets  CPE with pap  1.  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  Was told to repeat pap in 1 year.  She did receive HPV vaccine x 3 at [facility].    2.  Mammogram:  Never.  No family history of breast cancer.    3.  Osteoprevention:  2 servings of Lactaid milk daily.  Walks 1 hour 3-4 times weekly.    4.  Guaiac Cards/FIT:  Never.    5.  Colonoscopy:  Never.  No family history of colon cancer  6.  Immunizations:  Out of both influenza and COvID vaccinations today.  Recommend both Immunization History  Administered Date(s) Administered   HPV 9-valent 01/27/2024, 04/27/2024, 07/25/2024   Influenza, Mdck, Trivalent,PF 6+ MOS(egg free) 08/25/2023   Tdap 12/16/2015, 12/01/2020     7.  Glucose/Cholesterol:  Fasting glucose and cholesterol panels all okay on 10/31, but A1C back up to 6.2%.   Lipid Panel     Component Value Date/Time   CHOL 167 10/14/2024 1040   TRIG 72 10/14/2024 1040   HDL 50 10/14/2024 1040   CHOLHDL 3.3 10/14/2024 1040   LDLCALC 103 (H) 10/14/2024 1040   LABVLDL 14 10/14/2024 1040   Meds  Only taking Tylenol  She later states she does use Claritin  10 mg as needed for allergies.    Allergies  Allergen Reactions   Amoxicillin Rash   Ibuprofen  Itching   Naproxen Itching   Penicillins Rash   Past Medical History:  Diagnosis Date   Environmental allergies 2018   GERD (gastroesophageal reflux disease) 2018   Clinic in [facility] [place].  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   Obesity (BMI 35.0-39.9 without comorbidity)    Prediabetes    Past Surgical History:  Procedure Laterality Date   CESAREAN SECTION  2012, 2014   CESAREAN SECTION     TUBAL LIGATION  2014   with last c section   Family History   Problem Relation Age of Onset   Diabetes Mother    Hypertension Mother    Diabetes Sister    Social History   Socioeconomic History   Marital status: Married    Spouse name: Gladis Code   Number of children: 2   Years of education: 12   Highest education level: 12th grade  Occupational History   Occupation: housewife   Occupation: Human resources officer  Tobacco Use   Smoking status: Never    Passive exposure: Never   Smokeless tobacco: Never  Vaping Use   Vaping status: Never Used  Substance and Sexual Activity   Alcohol use: Not Currently   Drug use: Never   Sexual activity: Yes    Birth control/protection: Surgical  Other Topics Concern   Not on file  Social History Narrative   Moved from [place]  to [place] beginning in 2018   Lives at home with husband and 2 daughters.   Social Drivers of Corporate Investment Banker Strain: Low Risk  (10/24/2024)   Overall Financial Resource Strain (CARDIA)    Difficulty of Paying Living Expenses: Not very hard  Food Insecurity: No Food Insecurity (10/24/2024)   Hunger Vital Sign    Worried About Running  Out of Food in the Last Year: Never true    Ran Out of Food in the Last Year: Never true  Transportation Needs: No Transportation Needs (10/24/2024)   PRAPARE - Administrator, Civil Service (Medical): No    Lack of Transportation (Non-Medical): No  Physical Activity: Insufficiently Active (12/17/2018)   Exercise Vital Sign    Days of Exercise per Week: 3 days    Minutes of Exercise per Session: 30 min  Stress: Not on file  Social Connections: Somewhat Isolated (12/17/2018)   Social Connection and Isolation Panel    Frequency of Communication with Friends and Family: More than three times a week    Frequency of Social Gatherings with Friends and Family: More than three times a week    Attends Religious Services: Never    Database Administrator or Organizations: No    Attends Banker Meetings: Never     Marital Status: Married  Catering Manager Violence: Not At Risk (10/24/2024)   Humiliation, Afraid, Rape, and Kick questionnaire    Fear of Current or Ex-Partner: No    Emotionally Abused: No    Physically Abused: No    Sexually Abused: No      Review of Systems  HENT:  Positive for dental problem (appt with dental clinic cancelled 2 months and she has not heard back. Needs more fillings.).   Respiratory:  Negative for shortness of breath.   Cardiovascular:  Negative for chest pain.  Gastrointestinal:  Negative for blood in stool (No melena).       Is having reflux symptoms and feels she needs medication. Better if avoids spicy foods      Objective:   BP (!) 132/91 (BP Location: Left Arm, Cuff Size: Normal)   Pulse 96   Resp 19   Ht 5' (1.524 m)   Wt 193 lb 8 oz (87.8 kg)   LMP 10/05/2024 (Exact Date)   BMI 37.79 kg/m   Physical Exam Constitutional:      Appearance: She is obese.  HENT:     Head: Normocephalic and atraumatic.     Right Ear: Tympanic membrane, ear canal and external ear normal.     Left Ear: Tympanic membrane, ear canal and external ear normal.     Nose: Nose normal.     Mouth/Throat:     Mouth: Mucous membranes are moist.     Pharynx: Oropharynx is clear.  Eyes:     Extraocular Movements: Extraocular movements intact.     Conjunctiva/sclera: Conjunctivae normal.     Pupils: Pupils are equal, round, and reactive to light.     Comments: Discs sharp Bilateral pterygium  Neck:     Thyroid: No thyroid mass or thyromegaly.  Cardiovascular:     Rate and Rhythm: Normal rate and regular rhythm.     Heart sounds: S1 normal and S2 normal. No murmur heard.    No friction rub. No S3 or S4 sounds.     Comments: Carotid, radial, femoral, DP and PT pulses normal and equal.   Pulmonary:     Effort: Pulmonary effort is normal.     Breath sounds: Normal breath sounds and air entry.  Chest:  Breasts:    Right: No inverted nipple or mass.     Left: No  inverted nipple, mass or nipple discharge.  Abdominal:     General: Bowel sounds are normal.     Palpations: Abdomen is soft. There is no hepatomegaly, splenomegaly or  mass.     Tenderness: There is no abdominal tenderness.     Hernia: No hernia is present.  Genitourinary:    Comments: Normal external female genitalia No vaginal discharge No cervical or vaginal mucosal lesions. No uterine or adnexal mass or tenderness. Musculoskeletal:        General: Normal range of motion.     Cervical back: Normal range of motion and neck supple.     Right lower leg: No edema.     Left lower leg: No edema.  Lymphadenopathy:     Head:     Right side of head: No submental or submandibular adenopathy.     Left side of head: No submental or submandibular adenopathy.     Cervical: No cervical adenopathy.     Upper Body:     Right upper body: No supraclavicular or axillary adenopathy.     Left upper body: No supraclavicular or axillary adenopathy.     Lower Body: No right inguinal adenopathy. No left inguinal adenopathy.  Skin:    General: Skin is warm.     Capillary Refill: Capillary refill takes less than 2 seconds.     Findings: No rash.  Neurological:     General: No focal deficit present.     Mental Status: She is alert and oriented to person, place, and time.     Cranial Nerves: Cranial nerves 2-12 are intact.     Sensory: Sensation is intact.     Motor: Motor function is intact.     Coordination: Coordination is intact.     Gait: Gait is intact.     Deep Tendon Reflexes: Reflexes are normal and symmetric.  Psychiatric:        Mood and Affect: Mood normal.        Behavior: Behavior normal.      Assessment & Plan   CPE with pap Encouraged COVID and influenza vaccines  2.  Elevated BP:  return for BP check in 2 weeks  3.  Prediabetes/obesity:  to work on lifestyle changes.  Repeat A1C in 6 months and with me 2d later  4.  Hx of hypercholesterolemia:  fine currently  5.   Bilateral pterygium and eye strain:  optometry referral.  Sunglasses when outdoors.  6.  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  Await pap results.  Has received full HPV vaccinations.    7.  Dental cavities:  refer back to dental clinic to complete treatment.

## 2024-10-31 LAB — CYTOLOGY - PAP

## 2024-11-23 ENCOUNTER — Ambulatory Visit: Payer: Self-pay | Admitting: Internal Medicine

## 2024-11-23 NOTE — Telephone Encounter (Signed)
 Patient has been notified , patient agrees and has no other questions.

## 2024-11-25 ENCOUNTER — Encounter (HOSPITAL_COMMUNITY): Payer: Self-pay

## 2024-11-25 ENCOUNTER — Ambulatory Visit (HOSPITAL_COMMUNITY)
Admission: EM | Admit: 2024-11-25 | Discharge: 2024-11-25 | Disposition: A | Discharge status: Home / Self Care | Payer: Self-pay

## 2024-11-25 DIAGNOSIS — M5432 Sciatica, left side: Secondary | ICD-10-CM

## 2024-11-25 DIAGNOSIS — N3001 Acute cystitis with hematuria: Secondary | ICD-10-CM

## 2024-11-25 LAB — POCT URINE DIPSTICK
Bilirubin, UA: NEGATIVE
Glucose, UA: NEGATIVE mg/dL
Ketones, POC UA: NEGATIVE mg/dL
Nitrite, UA: POSITIVE — AB
Spec Grav, UA: 1.02 (ref 1.010–1.025)
Urobilinogen, UA: 0.2 U/dL
pH, UA: 7 (ref 5.0–8.0)

## 2024-11-25 LAB — POCT URINE PREGNANCY: Preg Test, Ur: NEGATIVE

## 2024-11-25 MED ORDER — DEXAMETHASONE SOD PHOSPHATE PF 10 MG/ML IJ SOLN
10.0000 mg | Freq: Once | INTRAMUSCULAR | Status: AC
  Administered 2024-11-25: 10 mg via INTRAMUSCULAR

## 2024-11-25 MED ORDER — PHENAZOPYRIDINE HCL 200 MG PO TABS: 200.0000 mg | ORAL_TABLET | Freq: Three times a day (TID) | ORAL | 0 refills | Status: DC

## 2024-11-25 MED ORDER — PREDNISONE 20 MG PO TABS: 40.0000 mg | ORAL_TABLET | Freq: Every day | ORAL | 0 refills | Status: AC

## 2024-11-25 MED ORDER — CEFUROXIME AXETIL 250 MG PO TABS: 250.0000 mg | ORAL_TABLET | Freq: Two times a day (BID) | ORAL | 0 refills | Status: AC

## 2024-11-25 NOTE — ED Provider Notes (Signed)
 [facility]  Note:  This document was prepared using Conservation officer, historic buildings and may include unintentional dictation errors.  MRN: [id number] DOB: Mar 13, 1991  Subjective:   Janice Mccormick is a 33 y.o. female presenting for for evaluation of lower abdominal pain x 4 days.  Patient denies taking any over-the-counter medication to treat symptoms prior to arrival in urgent care.  Denies dysuria, increased urinary frequency, flank pain weakness, dizziness, fever.  Patient also reports past history of sciatica was concerned that symptoms may be related to sciatic pain.  Current Medications[1]   Allergies[2]  Past Medical History:  Diagnosis Date   Environmental allergies 2018   GERD (gastroesophageal reflux disease) 2018   Clinic in [facility].  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  Objective:    Vitals: BP (!) 144/85 (BP Location: Left Arm)   Pulse (!) 103   Temp 98 F (36.7 C) (Oral)   Resp 18   LMP 10/29/2024 (Approximate)   SpO2 95%   Physical Exam Vitals and nursing note reviewed.  Constitutional:      General: She is not in acute distress.    Appearance: Normal appearance. She is well-developed. She is not ill-appearing or toxic-appearing.  HENT:     Head: Normocephalic and atraumatic.  Cardiovascular:     Rate and Rhythm: Normal rate.  Pulmonary:     Effort: Pulmonary effort is normal. No respiratory distress.     Breath sounds: No stridor. No  wheezing.  Abdominal:     Tenderness: There is abdominal tenderness (Suprapubic abdominal pressure with palpation). There is no right CVA tenderness or left CVA tenderness.  Skin:    General: Skin is warm and dry.  Neurological:     General: No focal deficit present.     Mental Status: She is alert and oriented to person, place, and time.  Psychiatric:        Mood and Affect: Mood normal.        Behavior: Behavior normal.     Procedures  Results for orders placed or performed during the hospital encounter of 11/25/24 (from the past 24 hours)  POC Urinalysis Dipstick     Status: Abnormal   Collection Time: 11/25/24  1:10 PM  Result Value Ref Range   Color, UA yellow yellow   Clarity, UA clear clear   Glucose, UA negative negative mg/dL   Bilirubin, UA negative negative   Ketones, POC UA negative negative mg/dL   Spec Grav, UA 8.979 8.989 - 1.025   Blood, UA small (A) negative   pH, UA 7.0 5.0 - 8.0   POC PROTEIN,UA trace negative, trace   Urobilinogen, UA 0.2 0.2 or 1.0 E.U./dL   Nitrite, UA Positive (A) Negative   Leukocytes, UA Small (1+) (A) Negative  POCT urine pregnancy     Status: None   Collection Time: 11/25/24  1:10 PM  Result Value Ref Range   Preg Test, Ur Negative Negative    Assessment and Plan :     Discharge Instructions      1. Acute cystitis with hematuria (Primary) - POC Urinalysis Dipstick completed in UC shows small sites, small blood, positive nitrite, these findings are strongly indicative of urinary tract infection - POCT urine pregnancy completed in UC is negative for pregnancy - cefUROXime (CEFTIN) 250 MG tablet; Take 1 tablet (250 mg total) by mouth 2 (two) times daily with a meal for 7 days.  Dispense: 14 tablet; Refill: 0 - phenazopyridine (PYRIDIUM) 200 MG tablet; Take 1 tablet (200 mg total) by mouth 3 (three) times daily.  Dispense: 6 tablet; Refill: 0 - Urine Culture collected in UC and sent to lab for further testing results should be  available in 2 to 3 days.  2. Sciatica of left side - predniSONE  (DELTASONE ) 20 MG tablet; Take 2 tablets (40 mg total) by mouth daily for 5 days.  Dispense: 10 tablet; Refill: 0 - dexamethasone  (DECADRON ) injection 10 mg given in UC for acute lower back pain possibly secondary to sciatica exacerbation.  -Continue to monitor symptoms for any change in severity if there is any escalation of current symptoms or development of new symptoms follow-up in ER for further evaluation and management.      Zahriyah Joo B Berenise Hunton    [1]  Current Facility-Administered Medications:    dexamethasone  (DECADRON ) injection 10 mg, 10 mg, Intramuscular, Once, Ravneet Spilker B, NP  Current Outpatient Medications:    cefUROXime (CEFTIN) 250 MG tablet, Take 1 tablet (250 mg total) by mouth 2 (two) times daily with a meal for 7 days., Disp: 14 tablet, Rfl: 0   phenazopyridine (PYRIDIUM) 200 MG tablet, Take 1 tablet (200 mg total) by mouth 3 (three) times daily., Disp: 6 tablet, Rfl: 0   predniSONE  (DELTASONE ) 20 MG tablet, Take 2 tablets (40 mg total) by mouth daily for 5 days., Disp: 10 tablet, Rfl: 0   Soft Lens Products (SENSITIVE EYES SALINE) SOLN, by Does not apply route. (Patient not taking: Reported on 10/24/2024), Disp: , Rfl:  [2]  Allergies Allergen Reactions   Amoxicillin Rash   Ibuprofen  Itching   Naproxen Itching   Penicillins Rash  [3]  Social History Tobacco Use   Smoking status: Never    Passive exposure: Never   Smokeless tobacco: Never  Vaping Use   Vaping status: Never Used  Substance Use Topics   Alcohol use: Not Currently   Drug use: Never     Aurea Goodell B, NP 11/25/24 1324

## 2024-11-25 NOTE — Discharge Instructions (Addendum)
 1. Acute cystitis with hematuria (Primary) - POC Urinalysis Dipstick completed in UC shows small sites, small blood, positive nitrite, these findings are strongly indicative of urinary tract infection - POCT urine pregnancy completed in UC is negative for pregnancy - cefUROXime (CEFTIN) 250 MG tablet; Take 1 tablet (250 mg total) by mouth 2 (two) times daily with a meal for 7 days.  Dispense: 14 tablet; Refill: 0 - phenazopyridine (PYRIDIUM) 200 MG tablet; Take 1 tablet (200 mg total) by mouth 3 (three) times daily.  Dispense: 6 tablet; Refill: 0 - Urine Culture collected in UC and sent to lab for further testing results should be available in 2 to 3 days.  2. Sciatica of left side - predniSONE  (DELTASONE ) 20 MG tablet; Take 2 tablets (40 mg total) by mouth daily for 5 days.  Dispense: 10 tablet; Refill: 0 - dexamethasone  (DECADRON ) injection 10 mg given in UC for acute lower back pain possibly secondary to sciatica exacerbation.  -Continue to monitor symptoms for any change in severity if there is any escalation of current symptoms or development of new symptoms follow-up in ER for further evaluation and management.

## 2024-11-25 NOTE — ED Triage Notes (Addendum)
 Spanish Interpreter:  [id number] Patient reports lower abdominal pain x 4 days. States she has a history of sciatica pain. No medication have been taken for relief.

## 2024-11-26 LAB — URINE CULTURE
Culture: NO GROWTH
Special Requests: NORMAL

## 2024-11-28 ENCOUNTER — Ambulatory Visit (INDEPENDENT_AMBULATORY_CARE_PROVIDER_SITE_OTHER): Payer: Self-pay | Admitting: Otolaryngology

## 2024-11-28 ENCOUNTER — Ambulatory Visit (HOSPITAL_COMMUNITY): Payer: Self-pay

## 2025-01-16 ENCOUNTER — Ambulatory Visit (HOSPITAL_COMMUNITY)
Admission: EM | Admit: 2025-01-16 | Discharge: 2025-01-16 | Disposition: A | Discharge status: Home / Self Care | Payer: Self-pay | Source: Home / Self Care | Attending: Family Medicine | Admitting: Family Medicine

## 2025-01-16 ENCOUNTER — Encounter (HOSPITAL_COMMUNITY): Payer: Self-pay | Admitting: *Deleted

## 2025-01-16 ENCOUNTER — Other Ambulatory Visit: Payer: Self-pay

## 2025-01-16 DIAGNOSIS — M5432 Sciatica, left side: Secondary | ICD-10-CM

## 2025-01-16 DIAGNOSIS — M79605 Pain in left leg: Secondary | ICD-10-CM

## 2025-01-16 MED ORDER — KETOROLAC TROMETHAMINE 30 MG/ML IJ SOLN
INTRAMUSCULAR | Status: AC
  Filled 2025-01-16: qty 1

## 2025-01-16 MED ORDER — KETOROLAC TROMETHAMINE 30 MG/ML IJ SOLN
30.0000 mg | Freq: Once | INTRAMUSCULAR | Status: AC
  Administered 2025-01-16: 30 mg via INTRAMUSCULAR

## 2025-01-16 MED ORDER — KETOCONAZOLE 2 % EX CREA: 1.0000 | TOPICAL_CREAM | Freq: Every day | CUTANEOUS | 0 refills | Status: AC

## 2025-01-16 MED ORDER — TIZANIDINE HCL 4 MG PO TABS: 4.0000 mg | ORAL_TABLET | Freq: Every evening | ORAL | 0 refills | Status: AC | PRN

## 2025-01-16 NOTE — Discharge Instructions (Addendum)
 You have been given a shot of Toradol  30 mg today.  Ketorolac  10 mg tablets--take 1 tablet every 6 hours as needed for pain.  This is the same medicine that is in the shot we just gave you   Take tizanidine  4 mg--1 at bedtime as needed for muscle spasms; this medication can cause dizziness and sleepiness  Please make an appointment to follow-up with your primary care provider about this issue  [facility] le han administrado una inyeccin de Toradol  30 mg.  Comprimidos de ketorolaco 10 mg: tome un comprimido cada 6 horas segn sea necesario para el dolor. Este es el mismo medicamento que contiene la inyeccin que le acabamos de building services engineer.  Tome tizanidina 4 mg: un comprimido al [employer] segn sea necesario para los espasmos musculares; este medicamento puede causar mareos y somnolencia.  Por favor, programe una cita de seguimiento con su mdico de cabecera para hablar sobre este problema.)

## 2025-04-28 ENCOUNTER — Other Ambulatory Visit: Payer: Self-pay

## 2025-05-03 ENCOUNTER — Ambulatory Visit: Payer: Self-pay | Admitting: Internal Medicine

## 2025-10-27 ENCOUNTER — Other Ambulatory Visit: Payer: Self-pay | Admitting: Internal Medicine

## 2025-10-31 ENCOUNTER — Encounter: Payer: Self-pay | Admitting: Internal Medicine
